# Patient Record
Sex: Female | Born: 1990 | Race: Black or African American | Hispanic: No | Marital: Single | Smoking: Never smoker
Health system: Southern US, Community
[De-identification: ages and names within clinical notes are randomized; demographics above are authoritative.]

## PROBLEM LIST (undated history)

## (undated) DIAGNOSIS — Z202 Contact with and (suspected) exposure to infections with a predominantly sexual mode of transmission: Secondary | ICD-10-CM

## (undated) DIAGNOSIS — D649 Anemia, unspecified: Secondary | ICD-10-CM

## (undated) HISTORY — PX: TONSILLECTOMY: SUR1361

## (undated) HISTORY — PX: WISDOM TOOTH EXTRACTION: SHX21

## (undated) HISTORY — PX: OTHER SURGICAL HISTORY: SHX169

## (undated) HISTORY — DX: Contact with and (suspected) exposure to infections with a predominantly sexual mode of transmission: Z20.2

---

## 2013-03-28 NOTE — L&D Delivery Note (Signed)
Delivery Note At 8:36 AM a viable female was delivered via Vaginal, Spontaneous Delivery (Presentation: Left Occiput Anterior).  APGAR: 8, 8; weight 6 lb 10.9 oz (3030 g).   Placenta status: Intact, Spontaneous.  Cord: 3 vessels with the following complications: None.  Cord pH: NA  Anesthesia: Epidural  Episiotomy: None Lacerations: Periurethral Suture Repair: 3.0 vicryl Est. Blood Loss (mL): 350  Mom to postpartum.  Baby to Couplet care / Skin to Skin.  Jovani Colquhoun J. 12/03/2013, 5:40 PM

## 2013-04-26 LAB — OB RESULTS CONSOLE GC/CHLAMYDIA
CHLAMYDIA, DNA PROBE: NEGATIVE
Gonorrhea: NEGATIVE

## 2013-04-26 LAB — OB RESULTS CONSOLE HIV ANTIBODY (ROUTINE TESTING): HIV: NONREACTIVE

## 2013-04-26 LAB — OB RESULTS CONSOLE ABO/RH: RH Type: POSITIVE

## 2013-04-26 LAB — OB RESULTS CONSOLE HEPATITIS B SURFACE ANTIGEN: Hepatitis B Surface Ag: NEGATIVE

## 2013-04-26 LAB — OB RESULTS CONSOLE RPR: RPR: NONREACTIVE

## 2013-04-26 LAB — OB RESULTS CONSOLE RUBELLA ANTIBODY, IGM: Rubella: IMMUNE

## 2013-11-29 ENCOUNTER — Telehealth (HOSPITAL_COMMUNITY): Payer: Self-pay | Admitting: *Deleted

## 2013-11-29 ENCOUNTER — Encounter (HOSPITAL_COMMUNITY): Payer: Self-pay | Admitting: *Deleted

## 2013-11-29 LAB — OB RESULTS CONSOLE GBS: STREP GROUP B AG: POSITIVE

## 2013-11-29 NOTE — Telephone Encounter (Signed)
Preadmission screen  

## 2013-12-02 ENCOUNTER — Inpatient Hospital Stay (HOSPITAL_COMMUNITY)
Admission: AD | Admit: 2013-12-02 | Discharge: 2013-12-05 | DRG: 775 | Disposition: A | Discharge status: Home / Self Care | Payer: BC Managed Care – PPO | Source: Ambulatory Visit | Attending: Obstetrics and Gynecology | Admitting: Obstetrics and Gynecology

## 2013-12-02 ENCOUNTER — Encounter (HOSPITAL_COMMUNITY): Payer: Self-pay

## 2013-12-02 DIAGNOSIS — O9989 Other specified diseases and conditions complicating pregnancy, childbirth and the puerperium: Secondary | ICD-10-CM

## 2013-12-02 DIAGNOSIS — Z833 Family history of diabetes mellitus: Secondary | ICD-10-CM

## 2013-12-02 DIAGNOSIS — Z880 Allergy status to penicillin: Secondary | ICD-10-CM

## 2013-12-02 DIAGNOSIS — D696 Thrombocytopenia, unspecified: Secondary | ICD-10-CM | POA: Diagnosis present

## 2013-12-02 DIAGNOSIS — D689 Coagulation defect, unspecified: Secondary | ICD-10-CM | POA: Diagnosis present

## 2013-12-02 DIAGNOSIS — O9912 Other diseases of the blood and blood-forming organs and certain disorders involving the immune mechanism complicating childbirth: Secondary | ICD-10-CM

## 2013-12-02 DIAGNOSIS — D649 Anemia, unspecified: Secondary | ICD-10-CM | POA: Diagnosis present

## 2013-12-02 DIAGNOSIS — O99892 Other specified diseases and conditions complicating childbirth: Secondary | ICD-10-CM | POA: Diagnosis present

## 2013-12-02 DIAGNOSIS — Z22330 Carrier of Group B streptococcus: Secondary | ICD-10-CM

## 2013-12-02 DIAGNOSIS — O9902 Anemia complicating childbirth: Secondary | ICD-10-CM | POA: Diagnosis present

## 2013-12-02 HISTORY — DX: Anemia, unspecified: D64.9

## 2013-12-02 NOTE — MAU Note (Signed)
Pt states SROM at 1015pm-had some bleeding-mucus. Contractions every 15-30 mins.

## 2013-12-03 ENCOUNTER — Encounter (HOSPITAL_COMMUNITY): Payer: BC Managed Care – PPO | Admitting: Anesthesiology

## 2013-12-03 ENCOUNTER — Encounter (HOSPITAL_COMMUNITY): Payer: Self-pay | Admitting: *Deleted

## 2013-12-03 ENCOUNTER — Inpatient Hospital Stay (HOSPITAL_COMMUNITY): Payer: BC Managed Care – PPO | Admitting: Anesthesiology

## 2013-12-03 DIAGNOSIS — Z833 Family history of diabetes mellitus: Secondary | ICD-10-CM | POA: Diagnosis not present

## 2013-12-03 DIAGNOSIS — D649 Anemia, unspecified: Secondary | ICD-10-CM | POA: Diagnosis present

## 2013-12-03 DIAGNOSIS — O9912 Other diseases of the blood and blood-forming organs and certain disorders involving the immune mechanism complicating childbirth: Secondary | ICD-10-CM | POA: Diagnosis present

## 2013-12-03 DIAGNOSIS — Z880 Allergy status to penicillin: Secondary | ICD-10-CM | POA: Diagnosis not present

## 2013-12-03 DIAGNOSIS — D696 Thrombocytopenia, unspecified: Secondary | ICD-10-CM | POA: Diagnosis present

## 2013-12-03 DIAGNOSIS — O9902 Anemia complicating childbirth: Secondary | ICD-10-CM | POA: Diagnosis present

## 2013-12-03 DIAGNOSIS — Z22330 Carrier of Group B streptococcus: Secondary | ICD-10-CM | POA: Diagnosis not present

## 2013-12-03 DIAGNOSIS — O99892 Other specified diseases and conditions complicating childbirth: Secondary | ICD-10-CM | POA: Diagnosis present

## 2013-12-03 DIAGNOSIS — O479 False labor, unspecified: Secondary | ICD-10-CM | POA: Diagnosis present

## 2013-12-03 LAB — CBC
HCT: 35.8 % — ABNORMAL LOW (ref 36.0–46.0)
Hemoglobin: 12.1 g/dL (ref 12.0–15.0)
MCH: 28.9 pg (ref 26.0–34.0)
MCHC: 33.8 g/dL (ref 30.0–36.0)
MCV: 85.6 fL (ref 78.0–100.0)
PLATELETS: 132 10*3/uL — AB (ref 150–400)
RBC: 4.18 MIL/uL (ref 3.87–5.11)
RDW: 13.8 % (ref 11.5–15.5)
WBC: 8 10*3/uL (ref 4.0–10.5)

## 2013-12-03 LAB — POCT FERN TEST: POCT FERN TEST: NEGATIVE

## 2013-12-03 LAB — RPR

## 2013-12-03 LAB — ABO/RH: ABO/RH(D): O POS

## 2013-12-03 MED ORDER — FLEET ENEMA 7-19 GM/118ML RE ENEM: 1.0000 | ENEMA | RECTAL | Status: DC | PRN

## 2013-12-03 MED ORDER — ONDANSETRON HCL 4 MG/2ML IJ SOLN: 4.0000 mg | INTRAMUSCULAR | Status: DC | PRN

## 2013-12-03 MED ORDER — WITCH HAZEL-GLYCERIN EX PADS
1.0000 | MEDICATED_PAD | CUTANEOUS | Status: DC | PRN
Start: 2013-12-03 — End: 2013-12-05

## 2013-12-03 MED ORDER — LACTATED RINGERS IV SOLN
INTRAVENOUS | Status: DC
  Administered 2013-12-03 (×3): via INTRAVENOUS

## 2013-12-03 MED ORDER — LACTATED RINGERS IV SOLN: 500.0000 mL | INTRAVENOUS | Status: DC | PRN

## 2013-12-03 MED ORDER — CITRIC ACID-SODIUM CITRATE 334-500 MG/5ML PO SOLN: 30.0000 mL | ORAL | Status: DC | PRN

## 2013-12-03 MED ORDER — ACETAMINOPHEN 325 MG PO TABS: 650.0000 mg | ORAL_TABLET | ORAL | Status: DC | PRN

## 2013-12-03 MED ORDER — DIBUCAINE 1 % RE OINT: 1.0000 "application " | TOPICAL_OINTMENT | RECTAL | Status: DC | PRN

## 2013-12-03 MED ORDER — SIMETHICONE 80 MG PO CHEW: 80.0000 mg | CHEWABLE_TABLET | ORAL | Status: DC | PRN

## 2013-12-03 MED ORDER — SENNOSIDES-DOCUSATE SODIUM 8.6-50 MG PO TABS
2.0000 | ORAL_TABLET | ORAL | Status: DC
  Administered 2013-12-04 – 2013-12-05 (×2): 2 via ORAL
  Filled 2013-12-03 (×2): qty 2

## 2013-12-03 MED ORDER — LIDOCAINE HCL (PF) 1 % IJ SOLN
30.0000 mL | INTRAMUSCULAR | Status: DC | PRN
  Filled 2013-12-03 (×2): qty 30

## 2013-12-03 MED ORDER — LACTATED RINGERS IV SOLN
500.0000 mL | Freq: Once | INTRAVENOUS | Status: DC
Start: 2013-12-03 — End: 2013-12-03

## 2013-12-03 MED ORDER — LIDOCAINE HCL (PF) 1 % IJ SOLN
INTRAMUSCULAR | Status: DC | PRN
Start: 2013-12-03 — End: 2013-12-04
  Administered 2013-12-03: 10 mL

## 2013-12-03 MED ORDER — PRENATAL MULTIVITAMIN CH
1.0000 | ORAL_TABLET | Freq: Every day | ORAL | Status: DC
Start: 2013-12-03 — End: 2013-12-05
  Administered 2013-12-03 – 2013-12-05 (×3): 1 via ORAL
  Filled 2013-12-03 (×3): qty 1

## 2013-12-03 MED ORDER — IBUPROFEN 600 MG PO TABS
600.0000 mg | ORAL_TABLET | Freq: Four times a day (QID) | ORAL | Status: DC
Start: 2013-12-03 — End: 2013-12-05
  Administered 2013-12-03 – 2013-12-05 (×9): 600 mg via ORAL
  Filled 2013-12-03 (×9): qty 1

## 2013-12-03 MED ORDER — TERBUTALINE SULFATE 1 MG/ML IJ SOLN: 0.2500 mg | Freq: Once | INTRAMUSCULAR | Status: DC | PRN

## 2013-12-03 MED ORDER — PHENYLEPHRINE 40 MCG/ML (10ML) SYRINGE FOR IV PUSH (FOR BLOOD PRESSURE SUPPORT)
80.0000 ug | PREFILLED_SYRINGE | INTRAVENOUS | Status: DC | PRN
  Filled 2013-12-03: qty 2

## 2013-12-03 MED ORDER — ZOLPIDEM TARTRATE 5 MG PO TABS: 5.0000 mg | ORAL_TABLET | Freq: Every evening | ORAL | Status: DC | PRN

## 2013-12-03 MED ORDER — PHENYLEPHRINE 40 MCG/ML (10ML) SYRINGE FOR IV PUSH (FOR BLOOD PRESSURE SUPPORT)
80.0000 ug | PREFILLED_SYRINGE | INTRAVENOUS | Status: DC | PRN
  Filled 2013-12-03: qty 2
  Filled 2013-12-03: qty 10

## 2013-12-03 MED ORDER — ONDANSETRON HCL 4 MG/2ML IJ SOLN: 4.0000 mg | Freq: Four times a day (QID) | INTRAMUSCULAR | Status: DC | PRN

## 2013-12-03 MED ORDER — FENTANYL 2.5 MCG/ML BUPIVACAINE 1/10 % EPIDURAL INFUSION (WH - ANES)
14.0000 mL/h | INTRAMUSCULAR | Status: DC | PRN
  Filled 2013-12-03: qty 125

## 2013-12-03 MED ORDER — OXYTOCIN BOLUS FROM INFUSION: 500.0000 mL | INTRAVENOUS | Status: DC

## 2013-12-03 MED ORDER — OXYCODONE-ACETAMINOPHEN 5-325 MG PO TABS: 2.0000 | ORAL_TABLET | ORAL | Status: DC | PRN

## 2013-12-03 MED ORDER — METHYLERGONOVINE MALEATE 0.2 MG PO TABS: 0.2000 mg | ORAL_TABLET | ORAL | Status: DC | PRN

## 2013-12-03 MED ORDER — NALBUPHINE HCL 10 MG/ML IJ SOLN: 5.0000 mg | INTRAMUSCULAR | Status: DC | PRN

## 2013-12-03 MED ORDER — OXYTOCIN 40 UNITS IN LACTATED RINGERS INFUSION - SIMPLE MED
1.0000 m[IU]/min | INTRAVENOUS | Status: DC
  Administered 2013-12-03: 2 m[IU]/min via INTRAVENOUS
  Filled 2013-12-03: qty 1000

## 2013-12-03 MED ORDER — OXYCODONE-ACETAMINOPHEN 5-325 MG PO TABS: 1.0000 | ORAL_TABLET | ORAL | Status: DC | PRN

## 2013-12-03 MED ORDER — LANOLIN HYDROUS EX OINT: TOPICAL_OINTMENT | CUTANEOUS | Status: DC | PRN

## 2013-12-03 MED ORDER — METHYLERGONOVINE MALEATE 0.2 MG/ML IJ SOLN: 0.2000 mg | INTRAMUSCULAR | Status: DC | PRN

## 2013-12-03 MED ORDER — FENTANYL 2.5 MCG/ML BUPIVACAINE 1/10 % EPIDURAL INFUSION (WH - ANES)
14.0000 mL/h | INTRAMUSCULAR | Status: DC | PRN
  Administered 2013-12-03: 14 mL/h via EPIDURAL

## 2013-12-03 MED ORDER — OXYTOCIN 40 UNITS IN LACTATED RINGERS INFUSION - SIMPLE MED: 62.5000 mL/h | INTRAVENOUS | Status: DC

## 2013-12-03 MED ORDER — DIPHENHYDRAMINE HCL 25 MG PO CAPS: 25.0000 mg | ORAL_CAPSULE | Freq: Four times a day (QID) | ORAL | Status: DC | PRN

## 2013-12-03 MED ORDER — FERROUS SULFATE 325 (65 FE) MG PO TABS
325.0000 mg | ORAL_TABLET | Freq: Two times a day (BID) | ORAL | Status: DC
  Administered 2013-12-03 – 2013-12-05 (×4): 325 mg via ORAL
  Filled 2013-12-03 (×4): qty 1

## 2013-12-03 MED ORDER — BENZOCAINE-MENTHOL 20-0.5 % EX AERO
1.0000 "application " | INHALATION_SPRAY | CUTANEOUS | Status: DC | PRN
  Administered 2013-12-03: 1 via TOPICAL
  Filled 2013-12-03: qty 56

## 2013-12-03 MED ORDER — ONDANSETRON HCL 4 MG PO TABS: 4.0000 mg | ORAL_TABLET | ORAL | Status: DC | PRN

## 2013-12-03 MED ORDER — EPHEDRINE 5 MG/ML INJ
10.0000 mg | INTRAVENOUS | Status: DC | PRN
  Filled 2013-12-03: qty 2

## 2013-12-03 MED ORDER — OXYCODONE-ACETAMINOPHEN 5-325 MG PO TABS
1.0000 | ORAL_TABLET | ORAL | Status: DC | PRN
  Filled 2013-12-03: qty 1

## 2013-12-03 MED ORDER — DIPHENHYDRAMINE HCL 50 MG/ML IJ SOLN: 12.5000 mg | INTRAMUSCULAR | Status: DC | PRN

## 2013-12-03 MED ORDER — CLINDAMYCIN PHOSPHATE 900 MG/50ML IV SOLN
900.0000 mg | Freq: Three times a day (TID) | INTRAVENOUS | Status: DC
  Administered 2013-12-03: 900 mg via INTRAVENOUS
  Filled 2013-12-03 (×3): qty 50

## 2013-12-03 NOTE — Anesthesia Postprocedure Evaluation (Signed)
  Anesthesia Post-op Note  Patient: Amber Lambert  Procedure(s) Performed: * No procedures listed *  Patient Location: Mother/Baby  Anesthesia Type:Epidural  Level of Consciousness: awake  Airway and Oxygen Therapy: Patient Spontanous Breathing  Post-op Pain: mild  Post-op Assessment: Patient's Cardiovascular Status Stable and Respiratory Function Stable  Post-op Vital Signs: stable  Last Vitals:  Filed Vitals:   12/03/13 1210  BP: 117/76  Pulse: 65  Temp: 37.1 C  Resp: 16    Complications: No apparent anesthesia complications

## 2013-12-03 NOTE — Lactation Note (Signed)
This note was copied from the chart of Boy Jizelle Conkey. Lactation Consultation Note  Patient Name: Boy Gussie Murton [id number] Date: 12/03/2013 Reason for consult: Initial assessment of this primipara and her newborn at 14 hours postpartum.  Mom is getting ready to take a shower and baby is asleep in open crib on his back.  Mom says she has been shown hand expression by her nurse and that baby has been latching well since delivery.  Baby had LATCH scores of 8 and 9 per RN assessment but was sleepy at recent STS breastfeeding attempt.  LC encouraged frequent STS and cue feedings.  LC encouraged review of Baby and Me pp 9, 14 and 20-25 for STS and BF information. LC provided [employer] brochure and reviewed [facility] services and list of community and web site resources.    Maternal Data Formula Feeding for Exclusion: No Has patient been taught Hand Expression?: Yes (mom states that her nurse has shown her how to hand express) Does the patient have breastfeeding experience prior to this delivery?: No  Feeding Feeding Type: Breast Fed  LATCH Score/Interventions Latch: Grasps breast easily, tongue down, lips flanged, rhythmical sucking. Intervention(s): Skin to skin;Teach feeding cues;Waking techniques  Audible Swallowing: None Intervention(s): Skin to skin;Hand expression Intervention(s): Skin to skin;Hand expression;Alternate breast massage  Type of Nipple: Flat Intervention(s): No intervention needed  Comfort (Breast/Nipple): Soft / non-tender     Hold (Positioning): Assistance needed to correctly position infant at breast and maintain latch. Intervention(s): Breastfeeding basics reviewed;Support Pillows;Position options;Skin to skin  LATCH Score: 6 (previous breastfeeding LATCH scores=8/9)  Lactation Tools Discussed/Used   STS, cue feedings, hand expression  Consult Status Consult Status: Follow-up Date: 12/04/13 Follow-up type: In-patient    Warrick Parisian  [facility] 12/03/2013, 10:39 PM

## 2013-12-03 NOTE — MAU Provider Note (Signed)
Ms. Amber Lambert is a 23 y.o. G1P0 at [redacted]w[redacted]d  who presents to [name] today complaining LOF and vaginal bleeding. The patient denies frequent contractions. She states ?ROM around 2230 today. She reports good fetal movement. She denies complications with the pregnancy.   BP 117/71  Pulse 75  Temp(Src) 98.3 F (36.8 C) (Oral)  Resp 18  Ht  (1.626 m)  Wt 174 lb (78.926 kg)  BMI 29.85 kg/m2  SpO2 99% GENERAL: Well-developed, well-nourished female in no acute distress.  HEENT: Normocephalic, atraumatic.  LUNGS: Normal effort HEART: Regular rate  ABDOMEN: Soft, nontender, nondistended.  PELVIC: Normal external female genitalia. Vagina is pink and rugated.  Small amount of dark brown blood noted in the vaginal vault.  negative pooling. Gravid uterus.   EXTREMITIES: No cyanosis, clubbing, or edema Dilation: 3 Effacement (%): 80 Cervical Position: Middle Station: -2 Exam by:: [name] RN  Fern - negative  Fetal Monitoring:  Baseline: 125 bpm, moderate variability, + accelerations, 2 decelerations noted Contractions: irregular  A: Membranes intact FHR decelerations  P: Report given to RN. CNM contacted. Will admit patient to L&D  Marny Lowenstein, PA-C 12/03/2013 12:52 AM

## 2013-12-03 NOTE — Anesthesia Preprocedure Evaluation (Signed)

## 2013-12-03 NOTE — Progress Notes (Addendum)
  Subjective: Feeling lots of pressure, o/w comfortable w/ epidural. Thinks she may have SROM'd around 0550.  Objective: BP 114/78  Pulse 78  Temp(Src) 98.6 F (37 C) (Oral)  Resp 18  Ht  (1.626 m)  Wt 174 lb (78.926 kg)  BMI 29.85 kg/m2  SpO2 100%      Results for orders placed during the hospital encounter of 12/02/13 (from the past 24 hour(s))  POCT FERN TEST     Status: None   Collection Time    12/03/13  1:19 AM      Result Value Ref Range   POCT Fern Test Negative = intact amniotic membranes    CBC     Status: Abnormal   Collection Time    12/03/13  5:18 AM      Result Value Ref Range   WBC 8.0  4.0 - 10.5 K/uL   RBC 4.18  3.87 - 5.11 MIL/uL   Hemoglobin 12.1  12.0 - 15.0 g/dL   HCT 16.1 (*) 09.6 - 04.5 %   MCV 85.6  78.0 - 100.0 fL   MCH 28.9  26.0 - 34.0 pg   MCHC 33.8  30.0 - 36.0 g/dL   RDW 40.9  81.1 - 91.4 %   Platelets 132 (*) 150 - 400 K/uL     FHT: BL 130 w/ mod variability, +accels, earlys, occ lates resolved w/ maternal resuscitative measures UC:   irregular, every 2-3 minutes SVE: 4/90/0 Pitocin at 6 mius/min Has received one dose of ABX SROM - leaking clear fluid IUPC & FSE placed  Assessment:  Progressive labor GBS positive Mild gestational thrombocytopenia  Plan: Continue w/ current POC Continue GBS prophylaxis Consult prn Anticipate progress and SVD  Sherre Scarlet CNM 12/03/2013, 5:59 AM

## 2013-12-03 NOTE — Progress Notes (Signed)
Lucianne Comes is a 23 y.o. G1P0 at [redacted]w[redacted]d  Subjective: Pt is uncomfortable .Marland Kitchen Feeling pressure Epidural has been bolused 3 times   Objective: BP 109/63  Pulse 80  Temp(Src) 98.6 F (37 C) (Axillary)  Resp 22  Ht  (1.626 m)  Wt 78.926 kg (174 lb)  BMI 29.85 kg/m2  SpO2 100%      FHT:  FHR: 130 bpm, variability: moderate,  accelerations:  Present,  decelerations:  Present early UC:   regular, every 2 minutes SVE:   Complete +2 station Labs: Lab Results  Component Value Date   WBC 8.0 12/03/2013   HGB 12.1 12/03/2013   HCT 35.8* 12/03/2013   MCV 85.6 12/03/2013   PLT 132* 12/03/2013    Assessment / Plan: 39 wks and 5 days in labor  Completely dilated anticipate svd    Cricket Goodlin J. 12/03/2013, 8:20 AM

## 2013-12-03 NOTE — Anesthesia Procedure Notes (Signed)
Epidural Patient location during procedure: OB  Preanesthetic Checklist Completed: patient identified, site marked, surgical consent, pre-op evaluation, timeout performed, IV checked, risks and benefits discussed and monitors and equipment checked  Epidural Patient position: sitting Prep: site prepped and draped and DuraPrep Patient monitoring: continuous pulse ox and blood pressure Approach: midline Injection technique: LOR air  Needle:  Needle type: Tuohy  Needle gauge: 17 G Needle length: 9 cm and 9 Needle insertion depth: 6 cm Catheter type: closed end flexible Catheter size: 19 Gauge Catheter at skin depth: 12 cm Test dose: negative  Assessment Events: blood not aspirated, injection not painful, no injection resistance, negative IV test and paresthesia  Additional Notes 1st catheter with transient R leg paresthesia Replaced catheter as above:  Dosing of Epidural:  1st dose, through catheter ............................................Marland Kitchen  Xylocaine 40 mg  2nd dose, through catheter, after waiting 3 minutes........Marland KitchenXylocaine 60 mg    ( 1% Xylo charted as a single dose in Epic Meds for ease of charting; actual dosing was fractionated as above, for saftey's sake)  As each dose occurred, patient was free of IV sx; and patient exhibited no evidence of SA injection.  Patient is more comfortable after epidural dosed. Please see RN's note for documentation of vital signs,and FHR which are stable.  Patient reminded not to try to ambulate with numb legs, and that an RN must be present when she attempts to get up.

## 2013-12-03 NOTE — H&P (Signed)
Amber Lambert is a 23 y.o. female, G1P0 at 39.5 weeks, pt of Dr. Charlotta Lambert, presented after calling w/ c/o ? LOF, q 15-30 min mild ctxs, and a small amount of brownish d/c since around 10:15 pm last evening. Reports active fetus.    Patient Active Problem List   Diagnosis Date Noted  . Normal labor 12/03/2013  . Allergy history, penicillin 12/03/2013  . Anemia 12/03/2013    History of present pregnancy: Patient entered care around 8 weeks.   EDC of 12/05/13 was established by 8.6 wk sono. Had +hCG on 04/26/13.   Anatomy scan: 21.4 weeks, with normal findings per pt report.   Additional [place] evaluations: Dating and viability @ 8.6 wks and later in the pregnancy for presentation per pt report.   Significant prenatal events: Struggled w/ low back pain and gluteal muscle pain for which she took Ibuprofen early on w/ relief. Last evaluation: 11/29/13  OB History   Grav Para Term Preterm Abortions TAB SAB Ect Mult Living   1              Past Medical History  Diagnosis Date  . Trichomonas contact, treated   . Anemia    Past Surgical History  Procedure Laterality Date  . Tonsillectomy    . Wisdom tooth extraction    . Addenoidectomy     Family History: family history includes Diabetes in her paternal grandfather; Hypertension in her paternal grandfather. and father. Paternal 1st cousin w/ Amber Lambert. Social History:  reports that she has never smoked. She does not have any smokeless tobacco history on file. She reports that she does not drink alcohol or use illicit drugs.FOB Amber Lambert present and supportive.   Prenatal Transfer Tool  Maternal Diabetes: No Genetic Screening: Declined Maternal Ultrasounds/Referrals: Normal Fetal Ultrasounds or other Referrals:  None Maternal Substance Abuse:  No Significant Maternal Medications:  Meds include: Other: PNV, Ibuprofen early in the pregnancy, sinus meds prn and FeS04 - started taking once daily on 11/29/13 Significant Maternal Lab Results: Lab  values include: Group B Strep positive; allergy to PCN    ROS: +FM, ctxs  Allergies  Allergen Reactions  . Penicillins Rash    As a child    Dilation: 3 Effacement (%): 80 Station: -2 Exam by:: Amber Confer RN Blood pressure 117/71, pulse 75, temperature 98.3 F (36.8 C), temperature source Oral, resp. rate 18, height  (1.626 m), weight 174 lb (78.926 kg), SpO2 99.00%.  Results for orders placed during the hospital encounter of 12/02/13 (from the past 24 hour(s))  POCT FERN TEST     Status: None   Collection Time    12/03/13  1:19 AM      Result Value Ref Range   POCT Fern Test Negative = intact amniotic membranes      Chest clear Heart RRR without murmur Abd gravid, soft btw ctxs, NT, FH CWD Cephalic by Leopolds EFW: 7-4 Pelvic: Old blood in vaginal vault. Neg pooling, neg valsalva, neg fern Ext: No edema. 2+DTRs bilaterally, no clonus elicited  FHR: BL 135-140 w/ mod variability, +accels, 2 late decels to 120s noted; resolved w/ position change UCs: Irregular, q 1-15 mins, palpate mild  Prenatal labs: ABO, Rh: O/Positive/-- (01/30 0000) Antibody:  Neg Rubella:   Immune RPR: Nonreactive (01/30 0000)  HBsAg: Negative (01/30 0000)  HIV: Non-reactive (01/30 0000)  GBS: Positive (09/04 0000)  Sickle cell: Neg Pap: Neg in March 2014 per pt report GC:  Neg on 05/01/13 Chlamydia: Neg on  05/01/13 Genetic screenings: Declined Glucola: Normal at 73 Other: NOB urine culture neg; NOB Hbg 12.1, 10.7 at 28 wks; normal TSH, varicella immune; neg wet mount on 08/28/13.   Assessment/Plan: IUP at 39.5 wks. 2 late decels noted while in [name]; now Cat 1 tracing Latent labor GBS positive. Will treat w/ Clindamycin due to possible urticaria w/ PCN. Anemia  Plan: Admitted to BS. Routine [facility] orders. GBS prophylaxis. Epidural as desired. Rv'd FHRT and latent labor status w/ pt and FOB. Rv'd Pitocin augmentation. Pt and FOB seem to understand rationale for  admission/augmentation and wish to proceed w/ plan. Consult as indicated. Anticipate progress and SVD.   Amber Askew, MS 12/03/2013, 1:41 AM

## 2013-12-04 LAB — CBC
HCT: 31.9 % — ABNORMAL LOW (ref 36.0–46.0)
Hemoglobin: 10.5 g/dL — ABNORMAL LOW (ref 12.0–15.0)
MCH: 28.4 pg (ref 26.0–34.0)
MCHC: 32.9 g/dL (ref 30.0–36.0)
MCV: 86.2 fL (ref 78.0–100.0)
PLATELETS: 137 10*3/uL — AB (ref 150–400)
RBC: 3.7 MIL/uL — AB (ref 3.87–5.11)
RDW: 13.8 % (ref 11.5–15.5)
WBC: 11.2 10*3/uL — AB (ref 4.0–10.5)

## 2013-12-04 NOTE — Progress Notes (Signed)
Postpartum Note Day # 1  S:  Patient resting comfortable in bed.  Pain controlled.  Tolerating general diet. No flatus, no BM.  Lochia moderate.  Ambulating without difficulty.  She denies n/v/f/c, SOB, or CP.  Pt plans on breastfeeding.  O: Temp:  [98.2 F (36.8 C)-98.8 F (37.1 C)] 98.4 F (36.9 C) (09/09 0650) Pulse Rate:  [56-106] 82 (09/09 0650) Resp:  [16-22] 20 (09/09 0650) BP: (101-133)/(63-82) 105/67 mmHg (09/09 0650) SpO2:  [100 %] 100 % (09/08 1610) Gen: A&Ox3, NAD CV: RRR, no MRG Resp: CTAB Abdomen: soft, NT, ND Uterus: firm, non-tender, below umbilicus Ext: No edema, no calf tenderness bilaterally  Labs:  Recent Labs  12/03/13 0518 12/04/13 0620  HGB 12.1 10.5*    A/P: Pt is a 23 y.o. G1P1001 s/p NSVD, PPD#1  - Pain well controlled -GU: Voiding freely -GI: Tolerating general diet -Activity: encouraged sitting up to chair and ambulation as tolerated -Prophylaxis: early ambulation -Labs: stable as above -Pt considering in-patient circumcision DISPO: Continue routine postpartum care, plan for discharge home tomorrow.  Myna Hidalgo, DO [phone] (pager) [phone] (office)

## 2013-12-05 MED ORDER — IBUPROFEN 600 MG PO TABS: 600.0000 mg | ORAL_TABLET | Freq: Four times a day (QID) | ORAL | Status: DC

## 2013-12-05 NOTE — Discharge Instructions (Signed)

## 2013-12-05 NOTE — Progress Notes (Signed)
Postpartum Note Day # 2  S:  Patient resting comfortable in bed.  Pain controlled.  Tolerating general diet. +BM.  Lochia moderate.  Ambulating without difficulty.  She denies n/v/f/c, SOB, or CP.  Pt plans on breastfeeding.  O: Temp:  [97.5 F (36.4 C)-98 F (36.7 C)] 98 F (36.7 C) (09/10 0653) Pulse Rate:  [54-71] 54 (09/10 0653) Resp:  [17-20] 17 (09/10 0653) BP: (106-108)/(72-75) 106/75 mmHg (09/10 0653) SpO2:  [100 %] 100 % (09/09 1807) Gen: A&Ox3, NAD CV: RRR, no MRG Resp: CTAB Abdomen: soft, NT, ND Uterus: firm, non-tender, below umbilicus Ext: No edema, no calf tenderness bilaterally  Labs:   Recent Labs  12/03/13 0518 12/04/13 0620  HGB 12.1 10.5*    A/P: Pt is a 23 y.o. G1P1001 s/p NSVD, PPD#2  - Pain well controlled -GU: Voiding freely -GI: Tolerating general diet -Activity: encouraged sitting up to chair and ambulation as tolerated -Prophylaxis: early ambulation -Labs: stable as above -Pt plans for outpatient circ DISPO: Plan for discharge home today as she is meeting postpartum milestones appropriately.  Myna Hidalgo, DO [phone] (pager) [phone] (office)

## 2013-12-10 NOTE — Discharge Summary (Signed)
Obstetric Discharge Summary Date of Admission: 12/03/13 Date of Discharge: 12/05/13 Reason for Admission: onset of labor and rupture of membranes Prenatal Procedures: none2 Intrapartum Procedures: spontaneous vaginal delivery and GBS prophylaxis Postpartum Procedures: none Complications-Operative and Postpartum: periurethral laceration Hemoglobin  Date Value Ref Range Status  12/04/2013 10.5* 12.0 - 15.0 g/dL Final     HCT  Date Value Ref Range Status  12/04/2013 31.9* 36.0 - 46.0 % Final   Hospital Course:  Amber Lambert is a 23 y.o. female, G1P0 at 39.5 weeks, pt of Dr. Charlotta Newton, presented after calling w/ c/o ? LOF, q 15-30 min mild ctxs, and a small amount of brownish discharge since the previous night (12/02/13).  On admission she was confirmed ruptured and found to be 3cm dilated.  Pregnancy complicated by: GBS positive, with PCN allergy and anemia.  She was managed expectantly and received an epidural for pain.  Internal monitors were placed including an IUPC and FSE.  Patient progressed to complete and delivery was performed by Dr. Richardson Dopp.  For information regarding the delivery please see the delivery summary.  Her postpartum course was uncomplicated and she was discharged home in stable condition on PPD#2.   Physical Exam: Gen: A&Ox3, NAD  CV: RRR, no MRG  Resp: CTAB  Abdomen: soft, NT, ND  Uterus: firm, non-tender, below umbilicus  Ext: No edema, no calf tenderness bilaterally   Discharge Diagnoses: Term Pregnancy-delivered  Discharge Information: Date: 12/10/2013 Activity: pelvic rest Diet: routine Medications: PNV, Ibuprofen and Tylenol Condition: stable Instructions: refer to practice specific booklet Discharge to: home Follow-up Information   Follow up with Myna Hidalgo, M, DO In 6 weeks.   Specialty:  Obstetrics and Gynecology   Contact information:   [address] [place] [address] [phone]       Newborn Data: Live born female  Birth  Weight: 6 lb 10.9 oz (3030 g) APGAR: 8, 8  Home with mother.  Myna Hidalgo, M 12/10/2013, 6:41 AM

## 2013-12-11 ENCOUNTER — Inpatient Hospital Stay (HOSPITAL_COMMUNITY): Admission: RE | Admit: 2013-12-11 | Payer: BC Managed Care – PPO | Source: Ambulatory Visit

## 2014-01-15 ENCOUNTER — Other Ambulatory Visit: Payer: Self-pay | Admitting: Nurse Practitioner

## 2014-01-15 ENCOUNTER — Other Ambulatory Visit (HOSPITAL_COMMUNITY)
Admission: RE | Admit: 2014-01-15 | Discharge: 2014-01-15 | Disposition: A | Discharge status: Home / Self Care | Payer: BC Managed Care – PPO | Source: Ambulatory Visit | Attending: Obstetrics & Gynecology | Admitting: Obstetrics & Gynecology

## 2014-01-15 DIAGNOSIS — Z1151 Encounter for screening for human papillomavirus (HPV): Secondary | ICD-10-CM | POA: Diagnosis present

## 2014-01-15 DIAGNOSIS — Z01411 Encounter for gynecological examination (general) (routine) with abnormal findings: Secondary | ICD-10-CM | POA: Diagnosis present

## 2014-01-15 DIAGNOSIS — R87810 Cervical high risk human papillomavirus (HPV) DNA test positive: Secondary | ICD-10-CM | POA: Diagnosis present

## 2014-01-17 LAB — CYTOLOGY - PAP

## 2014-01-28 ENCOUNTER — Encounter (HOSPITAL_COMMUNITY): Payer: Self-pay | Admitting: *Deleted

## 2014-02-05 ENCOUNTER — Other Ambulatory Visit: Payer: Self-pay | Admitting: Obstetrics & Gynecology

## 2018-03-28 NOTE — L&D Delivery Note (Addendum)
Delivery Note Called emergently for patient who just arrived in [name] with fetus crowning.  Pt states she drove from [place].   At 4:02 AM a viable and healthy female was delivered via Vaginal, Spontaneous (Presentation: OA ).  APGAR: 8, 9; weight  .   Placenta status: Spontaneous and grossly intact with 3 vessel Cord:  with the following complications: .none except precipitous delivery  Anesthesia:  none Episiotomy:  none Lacerations: Labial Suture Repair: none. Laceration was superficial and hemostatic Est. Blood Loss (mL): 350  Mom to postpartum.  Baby to Couplet care / Skin to Skin.  Hansel Feinstein 12/12/2018, 5:17 AM

## 2018-04-20 ENCOUNTER — Encounter: Payer: Self-pay | Admitting: Advanced Practice Midwife

## 2018-04-20 ENCOUNTER — Ambulatory Visit (INDEPENDENT_AMBULATORY_CARE_PROVIDER_SITE_OTHER): Payer: 59

## 2018-04-20 DIAGNOSIS — Z3201 Encounter for pregnancy test, result positive: Secondary | ICD-10-CM | POA: Diagnosis not present

## 2018-04-20 DIAGNOSIS — N926 Irregular menstruation, unspecified: Secondary | ICD-10-CM

## 2018-04-20 LAB — POCT URINE PREGNANCY: PREG TEST UR: POSITIVE — AB

## 2018-04-20 NOTE — Progress Notes (Signed)
I have reviewed the chart and agree with nursing staff's documentation of this patient's encounter.  Vonzella Nipple, PA-C 04/20/2018 11:00 AM

## 2018-04-20 NOTE — Progress Notes (Signed)
Amber Lambert presents today for UPT. She has no unusual complaints. LMP: 03/17/19    OBJECTIVE: Appears well, in no apparent distress.  OB History    Gravida  2   Para  1   Term  1   Preterm      AB      Living  1     SAB      TAB      Ectopic      Multiple      Live Births  1          Home UPT Result: Positive In-Office UPT result: Positive  I have reviewed the patient's medical, obstetrical, social, and family histories, and medications.   ASSESSMENT: Positive pregnancy test  PLAN Prenatal care to be completed at:  [facility] PNV, samples given

## 2018-04-30 ENCOUNTER — Encounter: Payer: Self-pay | Admitting: *Deleted

## 2018-05-21 ENCOUNTER — Telehealth: Payer: Self-pay

## 2018-05-21 NOTE — Telephone Encounter (Signed)
Patient called in advising that she does not want to have the Genetic Screening test done at her Initial OB visit on 03/03//20 @ 1 pm. Patient states she didn't have it done with her nor does she want it done with this baby. Advised patient that is perfectly ok  - I would notate her chart accordingly but to remember to advise the provider as well. Patient stated she would and had no further questions or concerns at this time.

## 2018-05-29 ENCOUNTER — Ambulatory Visit (INDEPENDENT_AMBULATORY_CARE_PROVIDER_SITE_OTHER): Payer: Managed Care, Other (non HMO) | Admitting: Advanced Practice Midwife

## 2018-05-29 ENCOUNTER — Encounter: Payer: Self-pay | Admitting: Advanced Practice Midwife

## 2018-05-29 VITALS — BP 110/70 | HR 76 | Wt 160.2 lb

## 2018-05-29 DIAGNOSIS — Z3480 Encounter for supervision of other normal pregnancy, unspecified trimester: Secondary | ICD-10-CM | POA: Insufficient documentation

## 2018-05-29 DIAGNOSIS — O219 Vomiting of pregnancy, unspecified: Secondary | ICD-10-CM | POA: Diagnosis not present

## 2018-05-29 DIAGNOSIS — Z3A00 Weeks of gestation of pregnancy not specified: Secondary | ICD-10-CM | POA: Diagnosis not present

## 2018-05-29 DIAGNOSIS — O26891 Other specified pregnancy related conditions, first trimester: Secondary | ICD-10-CM

## 2018-05-29 DIAGNOSIS — R87619 Unspecified abnormal cytological findings in specimens from cervix uteri: Secondary | ICD-10-CM

## 2018-05-29 DIAGNOSIS — Z3A10 10 weeks gestation of pregnancy: Secondary | ICD-10-CM

## 2018-05-29 LAB — POCT URINALYSIS DIPSTICK OB
Blood, UA: NEGATIVE
Glucose, UA: NEGATIVE
Ketones, UA: NEGATIVE
LEUKOCYTES UA: NEGATIVE
Nitrite, UA: NEGATIVE
PH UA: 7 (ref 5.0–8.0)
POC,PROTEIN,UA: NEGATIVE
Spec Grav, UA: 1.015 (ref 1.010–1.025)

## 2018-05-29 MED ORDER — DOXYLAMINE-PYRIDOXINE 10-10 MG PO TBEC: DELAYED_RELEASE_TABLET | ORAL | 5 refills | Status: DC

## 2018-05-29 MED ORDER — CONCEPT OB 130-92.4-1 MG PO CAPS: 1.0000 | ORAL_CAPSULE | Freq: Every day | ORAL | 11 refills | Status: AC

## 2018-05-29 NOTE — Patient Instructions (Signed)

## 2018-05-29 NOTE — Progress Notes (Signed)
Subjective:   Amber Lambert is a 28 y.o. G2P1001 at Unknown by sure LMP being seen today for her first obstetrical visit.  Her obstetrical history is significant for none and has Allergy history, penicillin; Anemia; Vaginal delivery; and Supervision of other normal pregnancy, antepartum on their problem list.. Patient does intend to breast feed. Pregnancy history fully reviewed.  Patient reports nausea and vomiting.  HISTORY: OB History  Gravida Para Term Preterm AB Living  2 1 1  0 0 1  SAB TAB Ectopic Multiple Live Births  0 0 0 0 1    # Outcome Date GA Lbr Len/2nd Weight Sex Delivery Anes PTL Lv  2 Current           1 Term 12/03/13 [redacted]w[redacted]d 05:41 / 00:25 3030 g M Vag-Spont EPI  LIV     Name: Morency,BOY Mechele     Apgar1: 8  Apgar5: 8   Past Medical History:  Diagnosis Date  . Anemia   . Trichomonas contact, treated    Past Surgical History:  Procedure Laterality Date  . addenoidectomy    . TONSILLECTOMY    . WISDOM TOOTH EXTRACTION     Family History  Problem Relation Age of Onset  . Hypertension Paternal Grandfather   . Diabetes Paternal Grandfather   . Hypertension Father    Social History   Tobacco Use  . Smoking status: Never Smoker  . Smokeless tobacco: Never Used  Substance Use Topics  . Alcohol use: No  . Drug use: No   Allergies  Allergen Reactions  . Penicillins Rash    As a child   Current Outpatient Medications on File Prior to Visit  Medication Sig Dispense Refill  . Prenatal Vit-Fe Fumarate-FA (PRENATAL MULTIVITAMIN) TABS tablet Take 1 tablet by mouth daily at 12 noon.    . ferrous sulfate 325 (65 FE) MG tablet Take 325 mg by mouth daily with breakfast.    . ibuprofen (ADVIL,MOTRIN) 600 MG tablet Take 1 tablet (600 mg total) by mouth every 6 (six) hours. (Patient not taking: Reported on 05/29/2018) 30 tablet 0   No current facility-administered medications on file prior to visit.      Exam   Vitals:   05/29/18 1318  BP: 110/70    Pulse: 76  Weight: 72.7 kg      Uterus:     Pelvic Exam: Perineum: no hemorrhoids, normal perineum   Vulva: normal external genitalia, no lesions   Vagina:  normal mucosa, normal discharge   Cervix: no lesions and normal, pap smear done.    Adnexa: normal adnexa and no mass, fullness, tenderness   Bony Pelvis: average  System: General: well-developed, well-nourished female in no acute distress   Breast:  normal appearance, no masses or tenderness   Skin: normal coloration and turgor, no rashes   Neurologic: oriented, normal, negative, normal mood   Extremities: normal strength, tone, and muscle mass, ROM of all joints is normal   HEENT PERRLA, extraocular movement intact and sclera clear, anicteric   Mouth/Teeth mucous membranes moist, pharynx normal without lesions and dental hygiene good   Neck supple and no masses   Cardiovascular: regular rate and rhythm   Respiratory:  no respiratory distress, normal breath sounds   Abdomen: soft, non-tender; bowel sounds normal; no masses,  no organomegaly     Assessment:   Pregnancy: G2P1001 Patient Active Problem List   Diagnosis Date Noted  . Supervision of other normal pregnancy, antepartum 05/29/2018  . Vaginal  delivery 12/05/2013  . Allergy history, penicillin 12/03/2013  . Anemia 12/03/2013     Plan:  1. Supervision of other normal pregnancy, antepartum --Anticipatory guidance about next visits/weeks of pregnancy given. - Culture, OB Urine - Obstetric Panel, Including HIV - Hemoglobinopathy evaluation - Inheritest(R) CF/SMA Panel - Cytology - PAP( [place]) - Cervicovaginal ancillary only( [place]) - [facility] AMB BABYSCRIPTS OPT IN - Genetic Screening  2. Nausea and vomiting during pregnancy prior to [redacted] weeks gestation --Discussed dietary changes --Changed PNV to [facility] - Doxylamine-Pyridoxine (DICLEGIS) 10-10 MG TBEC; Take 2 tabs at bedtime. If needed, add another tab in the morning. If needed, add another tab  in the afternoon, up to 4 tabs/day.  Dispense: 100 tablet; Refill: 5   3. Abnormal cervical Papanicolaou smear, unspecified abnormal pap finding --Pt had abnormal Pap then normal colpo in December 2019.  A LEEP was recommended per pt. She did not have LEEP done.   --Records requested but if colpo was normal, may repeat pap PP  Initial labs drawn. Continue prenatal vitamins. Discussed and offered genetic screening options, including Quad screen/AFP, NIPS testing, and option to decline testing. Benefits/risks/alternatives reviewed. Pt aware that anatomy US is form of genetic screening with lower accuracy in detecting trisomies than blood work.  Pt chooses/declines genetic screening today. NIPS: ordered. Ultrasound discussed; fetal anatomic survey: requested. Problem list reviewed and updated. The nature of [facility] with multiple MDs and other Advanced Practice Providers was explained to patient; also emphasized that residents, students are part of our team. Routine obstetric precautions reviewed. No follow-ups on file.   Sharen Counter, CNM 05/29/18 1:41 PM

## 2018-05-29 NOTE — Addendum Note (Signed)
Addended by: Mikey Bussing on: 05/29/2018 02:45 PM   Modules accepted: Orders

## 2018-05-29 NOTE — Addendum Note (Signed)
Addended by: Sharen Counter A on: 05/29/2018 03:35 PM   Modules accepted: Orders

## 2018-05-30 ENCOUNTER — Encounter: Payer: 59 | Admitting: Obstetrics and Gynecology

## 2018-05-31 LAB — URINE CULTURE, OB REFLEX: Organism ID, Bacteria: NO GROWTH

## 2018-05-31 LAB — CULTURE, OB URINE

## 2018-06-09 LAB — HEMOGLOBINOPATHY EVALUATION
HEMOGLOBIN F QUANTITATION: 0 % (ref 0.0–2.0)
HGB C: 0 %
HGB S: 0 %
HGB VARIANT: 0 %
Hemoglobin A2 Quantitation: 2.3 % (ref 1.8–3.2)
Hgb A: 97.7 % (ref 96.4–98.8)

## 2018-06-09 LAB — OBSTETRIC PANEL, INCLUDING HIV
ANTIBODY SCREEN: NEGATIVE
Basophils Absolute: 0 10*3/uL (ref 0.0–0.2)
Basos: 1 %
EOS (ABSOLUTE): 0 10*3/uL (ref 0.0–0.4)
EOS: 0 %
HEMOGLOBIN: 12.4 g/dL (ref 11.1–15.9)
HIV SCREEN 4TH GENERATION: NONREACTIVE
Hematocrit: 36.2 % (ref 34.0–46.6)
Hepatitis B Surface Ag: NEGATIVE
Immature Grans (Abs): 0 10*3/uL (ref 0.0–0.1)
Immature Granulocytes: 1 %
Lymphocytes Absolute: 0.9 10*3/uL (ref 0.7–3.1)
Lymphs: 10 %
MCH: 27.5 pg (ref 26.6–33.0)
MCHC: 34.3 g/dL (ref 31.5–35.7)
MCV: 80 fL (ref 79–97)
Monocytes Absolute: 0.9 10*3/uL (ref 0.1–0.9)
Monocytes: 10 %
NEUTROS ABS: 6.9 10*3/uL (ref 1.4–7.0)
Neutrophils: 78 %
Platelets: 257 10*3/uL (ref 150–450)
RBC: 4.51 x10E6/uL (ref 3.77–5.28)
RDW: 13 % (ref 11.7–15.4)
RPR Ser Ql: NONREACTIVE
Rh Factor: POSITIVE
Rubella Antibodies, IGG: 3.76 index (ref >0.99)
WBC: 8.8 10*3/uL (ref 3.4–10.8)

## 2018-06-09 LAB — INHERITEST(R) CF/SMA PANEL

## 2018-06-26 ENCOUNTER — Encounter: Payer: Managed Care, Other (non HMO) | Admitting: Advanced Practice Midwife

## 2018-07-02 ENCOUNTER — Encounter: Payer: Managed Care, Other (non HMO) | Admitting: Advanced Practice Midwife

## 2018-07-18 ENCOUNTER — Ambulatory Visit (INDEPENDENT_AMBULATORY_CARE_PROVIDER_SITE_OTHER): Payer: Managed Care, Other (non HMO) | Admitting: Obstetrics & Gynecology

## 2018-07-18 ENCOUNTER — Encounter: Payer: Self-pay | Admitting: Obstetrics & Gynecology

## 2018-07-18 ENCOUNTER — Other Ambulatory Visit: Payer: Self-pay

## 2018-07-18 DIAGNOSIS — Z3482 Encounter for supervision of other normal pregnancy, second trimester: Secondary | ICD-10-CM

## 2018-07-18 DIAGNOSIS — Z3A17 17 weeks gestation of pregnancy: Secondary | ICD-10-CM

## 2018-07-18 DIAGNOSIS — Z3480 Encounter for supervision of other normal pregnancy, unspecified trimester: Secondary | ICD-10-CM

## 2018-07-18 NOTE — Progress Notes (Signed)
Pt on line for her webex appointment. Pt identified with 2 identifiers. Pt is [redacted]w[redacted]d. Pt has was enrolled in babyrx today. She has no concerns.

## 2018-07-18 NOTE — Patient Instructions (Signed)

## 2018-07-18 NOTE — Progress Notes (Signed)
   TELEHEALTH VIRTUAL OBSTETRICS VISIT ENCOUNTER NOTE  I connected with Amber Lambert on 07/18/18 at  3:15 PM EDT by telephone at home and verified that I am speaking with the correct person using two identifiers.   I discussed the limitations, risks, security and privacy concerns of performing an evaluation and management service by telephone and the availability of in person appointments. I also discussed with the patient that there may be a patient responsible charge related to this service. The patient expressed understanding and agreed to proceed.  Subjective:  Amber Lambert is a 28 y.o. G2P1001 at [redacted]w[redacted]d being followed for ongoing prenatal care.  She is currently monitored for the following issues for this low-risk pregnancy and has Allergy history, penicillin and Supervision of other normal pregnancy, antepartum on their problem list.  Patient reports no complaints. Reports fetal movement. Denies any contractions, bleeding or leaking of fluid.   The following portions of the patient's history were reviewed and updated as appropriate: allergies, current medications, past family history, past medical history, past social history, past surgical history and problem list.   Objective:   General:  Alert, oriented and cooperative.   Mental Status: Normal mood and affect perceived. Normal judgment and thought content.  Rest of physical exam deferred due to type of encounter  Assessment and Plan:  Pregnancy: G2P1001 at [redacted]w[redacted]d 1. Supervision of other normal pregnancy, antepartum  - Babyscripts Schedule Optimization - Enroll Patient in Babyscripts - [place] MFM OB COMP + 14 WK; Future  Preterm labor symptoms and general obstetric precautions including but not limited to vaginal bleeding, contractions, leaking of fluid and fetal movement were reviewed in detail with the patient.  I discussed the assessment and treatment plan with the patient. The patient was provided an opportunity to ask  questions and all were answered. The patient agreed with the plan and demonstrated an understanding of the instructions. The patient was advised to call back or seek an in-person office evaluation/go to [name] at [facility] for any urgent or concerning symptoms. Please refer to After Visit Summary for other counseling recommendations.   I provided 10 minutes of non-face-to-face time during this encounter.  Return for needs labs within 2 weeks.  Future Appointments  Date Time Provider Department Center  08/02/2018  1:00 PM WH-MFC [place] 3 WH-MFCUS MFC-US    Scheryl Darter, MD Center for [facility], [facility]

## 2018-08-02 ENCOUNTER — Other Ambulatory Visit: Payer: Managed Care, Other (non HMO)

## 2018-08-02 ENCOUNTER — Ambulatory Visit (HOSPITAL_COMMUNITY)
Admission: RE | Admit: 2018-08-02 | Discharge: 2018-08-02 | Disposition: A | Discharge status: Home / Self Care | Payer: Managed Care, Other (non HMO) | Source: Ambulatory Visit | Attending: Obstetrics and Gynecology | Admitting: Obstetrics and Gynecology

## 2018-08-02 ENCOUNTER — Other Ambulatory Visit: Payer: Self-pay

## 2018-08-02 VITALS — Wt 164.3 lb

## 2018-08-02 DIAGNOSIS — Z3A19 19 weeks gestation of pregnancy: Secondary | ICD-10-CM

## 2018-08-02 DIAGNOSIS — Z3480 Encounter for supervision of other normal pregnancy, unspecified trimester: Secondary | ICD-10-CM

## 2018-08-02 DIAGNOSIS — Z363 Encounter for antenatal screening for malformations: Secondary | ICD-10-CM | POA: Diagnosis not present

## 2018-08-04 LAB — AFP, SERUM, OPEN SPINA BIFIDA
AFP MoM: 1.05
AFP Value: 57.3 ng/mL
Gest. Age on Collection Date: 19.6 weeks
Maternal Age At EDD: 30.5 yr
OSBR Risk 1 IN: 10000
Test Results:: NEGATIVE
Weight: 164 [lb_av]

## 2018-08-10 ENCOUNTER — Encounter: Payer: Self-pay | Admitting: Obstetrics & Gynecology

## 2018-08-10 DIAGNOSIS — R87611 Atypical squamous cells cannot exclude high grade squamous intraepithelial lesion on cytologic smear of cervix (ASC-H): Secondary | ICD-10-CM

## 2018-08-10 DIAGNOSIS — Z3480 Encounter for supervision of other normal pregnancy, unspecified trimester: Secondary | ICD-10-CM

## 2018-08-13 DIAGNOSIS — R87611 Atypical squamous cells cannot exclude high grade squamous intraepithelial lesion on cytologic smear of cervix (ASC-H): Secondary | ICD-10-CM | POA: Insufficient documentation

## 2018-08-16 ENCOUNTER — Encounter: Payer: Self-pay | Admitting: Obstetrics & Gynecology

## 2018-08-16 DIAGNOSIS — Z3480 Encounter for supervision of other normal pregnancy, unspecified trimester: Secondary | ICD-10-CM

## 2018-08-16 DIAGNOSIS — D563 Thalassemia minor: Secondary | ICD-10-CM | POA: Insufficient documentation

## 2018-08-27 ENCOUNTER — Encounter: Payer: Self-pay | Admitting: Obstetrics & Gynecology

## 2018-08-29 ENCOUNTER — Encounter: Payer: Managed Care, Other (non HMO) | Admitting: Obstetrics & Gynecology

## 2018-09-03 ENCOUNTER — Encounter: Payer: Self-pay | Admitting: Obstetrics and Gynecology

## 2018-09-03 ENCOUNTER — Encounter: Payer: Managed Care, Other (non HMO) | Admitting: Obstetrics

## 2018-09-03 ENCOUNTER — Ambulatory Visit (INDEPENDENT_AMBULATORY_CARE_PROVIDER_SITE_OTHER): Payer: Managed Care, Other (non HMO) | Admitting: Obstetrics and Gynecology

## 2018-09-03 DIAGNOSIS — Z3480 Encounter for supervision of other normal pregnancy, unspecified trimester: Secondary | ICD-10-CM

## 2018-09-03 DIAGNOSIS — Z3482 Encounter for supervision of other normal pregnancy, second trimester: Secondary | ICD-10-CM

## 2018-09-03 DIAGNOSIS — D563 Thalassemia minor: Secondary | ICD-10-CM

## 2018-09-03 DIAGNOSIS — Z3A24 24 weeks gestation of pregnancy: Secondary | ICD-10-CM

## 2018-09-03 NOTE — Progress Notes (Signed)
Pt is unable to take BP currently, does not have cuff availabe right now.

## 2018-09-03 NOTE — Progress Notes (Signed)
   TELEHEALTH VIRTUAL OBSTETRICS VISIT ENCOUNTER NOTE  I connected with Amber Lambert on 09/03/18 at  2:45 PM EDT by telephone at home and verified that I am speaking with the correct person using two identifiers.   I discussed the limitations, risks, security and privacy concerns of performing an evaluation and management service by telephone and the availability of in person appointments. I also discussed with the patient that there may be a patient responsible charge related to this service. The patient expressed understanding and agreed to proceed.  Subjective:  Amber Lambert is a 28 y.o. G2P1001 at [redacted]w[redacted]d being followed for ongoing prenatal care.  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  Patient reports no complaints. Reports fetal movement. Denies any contractions, bleeding or leaking of fluid.   The following portions of the patient's history were reviewed and updated as appropriate: allergies, current medications, past family history, past medical history, past social history, past surgical history and problem list.   Objective:   General:  Alert, oriented and cooperative.   Mental Status: Normal mood and affect perceived. Normal judgment and thought content.  Rest of physical exam deferred due to type of encounter  Assessment and Plan:  Pregnancy: G2P1001 at [redacted]w[redacted]d 1. Supervision of other normal pregnancy, antepartum Patient is doing well without complaints Third trimester labs next visit  2. Alpha thalassemia silent carrier   Preterm labor symptoms and general obstetric precautions including but not limited to vaginal bleeding, contractions, leaking of fluid and fetal  movement were reviewed in detail with the patient.  I discussed the assessment and treatment plan with the patient. The patient was provided an opportunity to ask questions and all were answered. The patient agreed with the plan and demonstrated an understanding of the instructions. The patient was advised to call back or seek an in-person office evaluation/go to [name] at [facility] for any urgent or concerning symptoms. Please refer to After Visit Summary for other counseling recommendations.   I provided 11 minutes of non-face-to-face time during this encounter.  Return in about 4 weeks (around 10/01/2018) for IN PERSON, ROB, 2 hr glucola next visit.  Future Appointments  Date Time Provider [place]  09/03/2018  2:45 PM Agape Hardiman, Vickii Chafe, MD [place] None    Mora Bellman, MD Center for [employer], [place]

## 2018-09-26 ENCOUNTER — Telehealth: Payer: Self-pay

## 2018-09-26 NOTE — Telephone Encounter (Signed)
Pt called stating that she has been having headaches off and on for the past 3 days. I advised patient to check her BP while on the phone, BP reads: 108/66. Pt denies any visual changes. Pt reports good fetal movement. I advised pt to try tylenol to see if that helps with her symptoms. I also advised pt she is safe to take allergy medicine (Zyrtec/claritin/or benadryl) as this could be allergies. Advised pt to check BP periodically and let us know if it becomes elevated or go to the hospital. Pt verbalizes understanding.

## 2018-09-29 ENCOUNTER — Inpatient Hospital Stay (HOSPITAL_COMMUNITY)
Admission: AD | Admit: 2018-09-29 | Discharge: 2018-09-29 | Disposition: A | Discharge status: Home / Self Care | Payer: Managed Care, Other (non HMO) | Attending: Obstetrics & Gynecology | Admitting: Obstetrics & Gynecology

## 2018-09-29 ENCOUNTER — Other Ambulatory Visit: Payer: Self-pay

## 2018-09-29 DIAGNOSIS — Z3A28 28 weeks gestation of pregnancy: Secondary | ICD-10-CM | POA: Diagnosis not present

## 2018-09-29 DIAGNOSIS — O26893 Other specified pregnancy related conditions, third trimester: Secondary | ICD-10-CM | POA: Diagnosis not present

## 2018-09-29 DIAGNOSIS — R51 Headache: Secondary | ICD-10-CM | POA: Diagnosis present

## 2018-09-29 DIAGNOSIS — Z880 Allergy status to penicillin: Secondary | ICD-10-CM | POA: Diagnosis not present

## 2018-09-29 DIAGNOSIS — Z3493 Encounter for supervision of normal pregnancy, unspecified, third trimester: Secondary | ICD-10-CM

## 2018-09-29 MED ORDER — BUTALBITAL-APAP-CAFFEINE 50-325-40 MG PO TABS: 2.0000 | ORAL_TABLET | Freq: Four times a day (QID) | ORAL | 0 refills | Status: DC | PRN

## 2018-09-29 MED ORDER — BUTALBITAL-APAP-CAFFEINE 50-325-40 MG PO TABS: 2.0000 | ORAL_TABLET | Freq: Four times a day (QID) | ORAL | 0 refills | Status: AC | PRN

## 2018-09-29 NOTE — MAU Note (Addendum)
Amber Lambert is a 28 y.o. at [redacted]w[redacted]d here in [name] reporting:  headache Onset of complaint: ongoing since Tuesday. Has tried tylenol at home with no relief. Denies vision changes, swelling, or epigastric pain. Pain score: 4/10 Vitals:   09/29/18 1527  BP: 131/78  Pulse: 82  Resp: 16  Temp: 98 F (36.7 C)  SpO2: 99%    FHT: 144 via doppler Lab orders placed from triage: none Patient tearful during triage. When RN asked what was wrong patient verbalized "Im just frustrated but I understand". Patient had previously talked with CNM prior to nurse encounter. Patient is with young child. Reports she does not have anyone available to watch him and is driving herself.

## 2018-09-29 NOTE — MAU Provider Note (Signed)
History     CSN: [id number]  Arrival date and time: 09/29/18 1504   First Provider Initiated Contact with Patient 09/29/18 1516      Chief Complaint  Patient presents with  . Headache   HPI Amber Lambert is a 28 y.o. G2P1001 at 2865w1d who presents to [name] with chief complaint of headache. This is a new problem, onset two days ago. Patient rates her pain as 4/10. Patient's pain is located behind her right eye. Her pain does not radiate. She denies aggravating or alleviating factors. She took 1 extra strength Tylenol early this morning but did not experience relief.  Patient has her 30 year old son with her. She states her husband and her sister are her only options for child care and they are both out of town.  Patient denies history of headaches, migraines or elevated blood pressure. She denies vaginal bleeding, leaking of fluid, decreased fetal movement, fever, falls, or recent illness.   OB History    Gravida  2   Para  1   Term  1   Preterm      AB      Living  1     SAB      TAB      Ectopic      Multiple      Live Births  1           Past Medical History:  Diagnosis Date  . Anemia   . Trichomonas contact, treated     Past Surgical History:  Procedure Laterality Date  . addenoidectomy    . TONSILLECTOMY    . WISDOM TOOTH EXTRACTION      Family History  Problem Relation Age of Onset  . Hypertension Paternal Grandfather   . Diabetes Paternal Grandfather   . Hypertension Father     Social History   Tobacco Use  . Smoking status: Never Smoker  . Smokeless tobacco: Never Used  Substance Use Topics  . Alcohol use: No  . Drug use: No    Allergies:  Allergies  Allergen Reactions  . Penicillins Rash    As a child    Medications Prior to Admission  Medication Sig Dispense Refill Last Dose  . Doxylamine-Pyridoxine (DICLEGIS) 10-10 MG TBEC Take 2 tabs at bedtime. If needed, add another tab in the morning. If needed, add another tab in  the afternoon, up to 4 tabs/day. 100 tablet 5   . ferrous sulfate 325 (65 FE) MG tablet Take 325 mg by mouth daily with breakfast.     . ibuprofen (ADVIL,MOTRIN) 600 MG tablet Take 1 tablet (600 mg total) by mouth every 6 (six) hours. (Patient not taking: Reported on 05/29/2018) 30 tablet 0   . Prenat w/o A Vit-FeFum-FePo-FA (CONCEPT OB) 130-92.4-1 MG CAPS Take 1 capsule by mouth daily. 30 capsule 11   . Prenatal Vit-Fe Fumarate-FA (PRENATAL MULTIVITAMIN) TABS tablet Take 1 tablet by mouth daily at 12 noon.       Review of Systems  Constitutional: Negative for chills, fatigue and fever.  Eyes: Negative for visual disturbance.  Respiratory: Negative for shortness of breath.   Gastrointestinal: Negative for abdominal pain.  Genitourinary: Negative for vaginal bleeding.  Musculoskeletal: Negative for back pain.  Neurological: Positive for headaches. Negative for dizziness, seizures, syncope and weakness.  All other systems reviewed and are negative.  Physical Exam   Last menstrual period 03/16/2018, unknown if currently breastfeeding.  Physical Exam  Nursing note and vitals reviewed. Constitutional: She  is oriented to person, place, and time. She appears well-developed and well-nourished.  Cardiovascular: Normal rate.  Respiratory: Effort normal.  GI:  Gravid  Neurological: She is alert and oriented to person, place, and time. She has normal strength. No sensory deficit. She displays a negative Romberg sign.  Skin: Skin is warm and dry.  Psychiatric: She has a normal mood and affect. Her behavior is normal. Judgment and thought content normal.    [name] Course/MDM  Procedures  --CNM in triage to explain normal plan of care for managing headaches in pregnancy including medications and possible IV fluids --Visitor policy reviewed with patient, explained that she cannot progress to a room without child care for her 20 year old and RNs cannot be asked to provide child care. Patient  restates that she does not have anyone she can call and she is not comfortable with the 2 hours of oversight offered by CPS. --Normotensive, no severe signs or symptoms. Denies obstetric complaints --Next appointment is Monday 07/06. Patient offered short course of Fioricet to address pain since she cannot stay for treatment in [name]. Patient verbalizes agreement with this plan of care.  Patient Vitals for the past 24 hrs:  BP Temp Temp src Pulse Resp SpO2  09/29/18 1527 131/78 98 F (36.7 C) Oral 82 16 99 %   Meds ordered this encounter  Medications  . butalbital-acetaminophen-caffeine (FIORICET) 50-325-40 MG tablet    Sig: Take 2 tablets by mouth every 6 (six) hours as needed for headache.    Dispense:  20 tablet    Refill:  0    Order Specific Question:   Supervising Provider    Answer:   Verita Schneiders A [[id number]]   Assessment and Plan  --28 y.o. G2P1001 at [redacted]w[redacted]d  --Headache in pregnancy --Rx Fioricet, home headache treatments reviewed with patient --Discharge home in stable condition. Patient encouraged to return to [name] for pain not addressed by Fioricet once she obtains child care.  F/U: Next OB appt [facility] 10/01/2018  Darlina Rumpf, [place] 09/29/2018, 4:06 PM

## 2018-09-29 NOTE — Discharge Instructions (Signed)
General Headache Without Cause °A headache is pain or discomfort that is felt around the head or neck area. There are many causes and types of headaches. In some cases, the cause may not be found. °Follow these instructions at home: °Watch your condition for any changes. Let your doctor know about them. Take these steps to help with your condition: °Managing pain ° °  ° °· Take over-the-counter and prescription medicines only as told by your doctor. °· Lie down in a dark, quiet room when you have a headache. °· If told, put ice on your head and neck area: °? Put ice in a plastic bag. °? Place a towel between your skin and the bag. °? Leave the ice on for 20 minutes, 2-3 times per day. °· If told, put heat on the affected area. Use the heat source that your doctor recommends, such as a moist heat pack or a heating pad. °? Place a towel between your skin and the heat source. °? Leave the heat on for 20-30 minutes. °? Remove the heat if your skin turns bright red. This is very important if you are unable to feel pain, heat, or cold. You may have a greater risk of getting burned. °· Keep lights dim if bright lights bother you or make your headaches worse. °Eating and drinking °· Eat meals on a regular schedule. °· If you drink alcohol: °? Limit how much you use to: °§ 0-1 drink a day for women. °§ 0-2 drinks a day for men. °? Be aware of how much alcohol is in your drink. In the [place] one drink equals one 12 oz bottle of beer (355 mL), one 5 oz glass of wine (148 mL), or one 1½ oz glass of hard liquor (44 mL). °· Stop drinking caffeine, or reduce how much caffeine you drink. °General instructions ° °· Keep a journal to find out if certain things bring on headaches. For example, write down: °? What you eat and drink. °? How much sleep you get. °? Any change to your diet or medicines. °· Get a massage or try other ways to relax. °· Limit stress. °· Sit up straight. Do not tighten (tense) your muscles. °· Do not use any  products that contain nicotine or tobacco. This includes cigarettes, e-cigarettes, and chewing tobacco. If you need help quitting, ask your doctor. °· Exercise regularly as told by your doctor. °· Get enough sleep. This often means 7-9 hours of sleep each night. °· Keep all follow-up visits as told by your doctor. This is important. °Contact a doctor if: °· Your symptoms are not helped by medicine. °· You have a headache that feels different than the other headaches. °· You feel sick to your stomach (nauseous) or you throw up (vomit). °· You have a fever. °Get help right away if: °· Your headache gets very bad quickly. °· Your headache gets worse after a lot of physical activity. °· You keep throwing up. °· You have a stiff neck. °· You have trouble seeing. °· You have trouble speaking. °· You have pain in the eye or ear. °· Your muscles are weak or you lose muscle control. °· You lose your balance or have trouble walking. °· You feel like you will pass out (faint) or you pass out. °· You are mixed up (confused). °· You have a seizure. °Summary °· A headache is pain or discomfort that is felt around the head or neck area. °· There are many causes and   types of headaches. In some cases, the cause may not be found. °· Keep a journal to help find out what causes your headaches. Watch your condition for any changes. Let your doctor know about them. °· Contact a doctor if you have a headache that is different from usual, or if your headache is not helped by medicine. °· Get help right away if your headache gets very bad, you throw up, you have trouble seeing, you lose your balance, or you have a seizure. °This information is not intended to replace advice given to you by your health care provider. Make sure you discuss any questions you have with your health care provider. °Document Released: 12/22/2007 Document Revised: 10/02/2017 Document Reviewed: 10/02/2017 °Elsevier Patient Education © 2020 [employer] ° °

## 2018-10-01 ENCOUNTER — Other Ambulatory Visit: Payer: Self-pay

## 2018-10-01 ENCOUNTER — Ambulatory Visit (INDEPENDENT_AMBULATORY_CARE_PROVIDER_SITE_OTHER): Payer: Managed Care, Other (non HMO) | Admitting: Obstetrics & Gynecology

## 2018-10-01 ENCOUNTER — Other Ambulatory Visit: Payer: Managed Care, Other (non HMO)

## 2018-10-01 VITALS — BP 113/71 | HR 85 | Temp 97.1°F | Wt 174.7 lb

## 2018-10-01 DIAGNOSIS — Z3480 Encounter for supervision of other normal pregnancy, unspecified trimester: Secondary | ICD-10-CM

## 2018-10-01 DIAGNOSIS — Z3A28 28 weeks gestation of pregnancy: Secondary | ICD-10-CM

## 2018-10-01 DIAGNOSIS — R51 Headache: Secondary | ICD-10-CM

## 2018-10-01 DIAGNOSIS — O26893 Other specified pregnancy related conditions, third trimester: Secondary | ICD-10-CM

## 2018-10-01 NOTE — Patient Instructions (Signed)
Return to office for any scheduled appointments. Call the office or go to the [name] at [facility] [place] if:  You begin to have strong, frequent contractions  Your water breaks.  Sometimes it is a big gush of fluid, sometimes it is just a trickle that keeps getting your panties wet or running down your legs  You have vaginal bleeding.  It is normal to have a small amount of spotting if your cervix was checked.   You do not feel your baby moving like normal.  If you do not, get something to eat and drink and lay down and focus on feeling your baby move.   If your baby is still not moving like normal, you should call the office or go to [name].  Any other obstetric concerns.   Third Trimester of Pregnancy The third trimester is from week 28 through week 40 (months 7 through 9). The third trimester is a time when the unborn baby (fetus) is growing rapidly. At the end of the ninth month, the fetus is about 20 inches in length and weighs 6-10 pounds. Body changes during your third trimester Your body will continue to go through many changes during pregnancy. The changes vary from woman to woman. During the third trimester:  Your weight will continue to increase. You can expect to gain 25-35 pounds (11-16 kg) by the end of the pregnancy.  You may begin to get stretch marks on your hips, abdomen, and breasts.  You may urinate more often because the fetus is moving lower into your pelvis and pressing on your bladder.  You may develop or continue to have heartburn. This is caused by increased hormones that slow down muscles in the digestive tract.  You may develop or continue to have constipation because increased hormones slow digestion and cause the muscles that push waste through your intestines to relax.  You may develop hemorrhoids. These are swollen veins (varicose veins) in the rectum that can itch or be painful.  You may develop swollen, bulging veins (varicose veins)  in your legs.  You may have increased body aches in the pelvis, back, or thighs. This is due to weight gain and increased hormones that are relaxing your joints.  You may have changes in your hair. These can include thickening of your hair, rapid growth, and changes in texture. Some women also have hair loss during or after pregnancy, or hair that feels dry or thin. Your hair will most likely return to normal after your baby is born.  Your breasts will continue to grow and they will continue to become tender. A yellow fluid (colostrum) may leak from your breasts. This is the first milk you are producing for your baby.  Your belly button may stick out.  You may notice more swelling in your hands, face, or ankles.  You may have increased tingling or numbness in your hands, arms, and legs. The skin on your belly may also feel numb.  You may feel short of breath because of your expanding uterus.  You may have more problems sleeping. This can be caused by the size of your belly, increased need to urinate, and an increase in your body's metabolism.  You may notice the fetus "dropping," or moving lower in your abdomen (lightening).  You may have increased vaginal discharge.  You may notice your joints feel loose and you may have pain around your pelvic bone. What to expect at prenatal visits You will have   prenatal exams every 2 weeks until week 36. Then you will have weekly prenatal exams. During a routine prenatal visit:  You will be weighed to make sure you and the baby are growing normally.  Your blood pressure will be taken.  Your abdomen will be measured to track your baby's growth.  The fetal heartbeat will be listened to.  Any test results from the previous visit will be discussed.  You may have a cervical check near your due date to see if your cervix has softened or thinned (effaced).  You will be tested for Group B streptococcus. This happens between 35 and 37 weeks. Your  health care provider may ask you:  What your birth plan is.  How you are feeling.  If you are feeling the baby move.  If you have had any abnormal symptoms, such as leaking fluid, bleeding, severe headaches, or abdominal cramping.  If you are using any tobacco products, including cigarettes, chewing tobacco, and electronic cigarettes.  If you have any questions. Other tests or screenings that may be performed during your third trimester include:  Blood tests that check for low iron levels (anemia).  Fetal testing to check the health, activity level, and growth of the fetus. Testing is done if you have certain medical conditions or if there are problems during the pregnancy.  Nonstress test (NST). This test checks the health of your baby to make sure there are no signs of problems, such as the baby not getting enough oxygen. During this test, a belt is placed around your belly. The baby is made to move, and its heart rate is monitored during movement. What is false labor? False labor is a condition in which you feel small, irregular tightenings of the muscles in the womb (contractions) that usually go away with rest, changing position, or drinking water. These are called Braxton Hicks contractions. Contractions may last for hours, days, or even weeks before true labor sets in. If contractions come at regular intervals, become more frequent, increase in intensity, or become painful, you should see your health care provider. What are the signs of labor?  Abdominal cramps.  Regular contractions that start at 10 minutes apart and become stronger and more frequent with time.  Contractions that start on the top of the uterus and spread down to the lower abdomen and back.  Increased pelvic pressure and dull back pain.  A watery or bloody mucus discharge that comes from the vagina.  Leaking of amniotic fluid. This is also known as your "water breaking." It could be a slow trickle or a gush.  Let your health care provider know if it has a color or strange odor. If you have any of these signs, call your health care provider right away, even if it is before your due date. Follow these instructions at home: Medicines  Follow your health care provider's instructions regarding medicine use. Specific medicines may be either safe or unsafe to take during pregnancy.  Take a prenatal vitamin that contains at least 600 micrograms (mcg) of folic acid.  If you develop constipation, try taking a stool softener if your health care provider approves. Eating and drinking   Eat a balanced diet that includes fresh fruits and vegetables, whole grains, good sources of protein such as meat, eggs, or tofu, and low-fat dairy. Your health care provider will help you determine the amount of weight gain that is right for you.  Avoid raw meat and uncooked cheese. These carry germs that   can cause birth defects in the baby.  If you have low calcium intake from food, talk to your health care provider about whether you should take a daily calcium supplement.  Eat four or five small meals rather than three large meals a day.  Limit foods that are high in fat and processed sugars, such as fried and sweet foods.  To prevent constipation: ? Drink enough fluid to keep your urine clear or pale yellow. ? Eat foods that are high in fiber, such as fresh fruits and vegetables, whole grains, and beans. Activity  Exercise only as directed by your health care provider. Most women can continue their usual exercise routine during pregnancy. Try to exercise for 30 minutes at least 5 days a week. Stop exercising if you experience uterine contractions.  Avoid heavy lifting.  Do not exercise in extreme heat or humidity, or at high altitudes.  Wear low-heel, comfortable shoes.  Practice good posture.  You may continue to have sex unless your health care provider tells you otherwise. Relieving pain and  discomfort  Take frequent breaks and rest with your legs elevated if you have leg cramps or low back pain.  Take warm sitz baths to soothe any pain or discomfort caused by hemorrhoids. Use hemorrhoid cream if your health care provider approves.  Wear a good support bra to prevent discomfort from breast tenderness.  If you develop varicose veins: ? Wear support pantyhose or compression stockings as told by your healthcare provider. ? Elevate your feet for 15 minutes, 3-4 times a day. Prenatal care  Write down your questions. Take them to your prenatal visits.  Keep all your prenatal visits as told by your health care provider. This is important. Safety  Wear your seat belt at all times when driving.  Make a list of emergency phone numbers, including numbers for family, friends, the hospital, and police and fire departments. General instructions  Avoid cat litter boxes and soil used by cats. These carry germs that can cause birth defects in the baby. If you have a cat, ask someone to clean the litter box for you.  Do not travel far distances unless it is absolutely necessary and only with the approval of your health care provider.  Do not use hot tubs, steam rooms, or saunas.  Do not drink alcohol.  Do not use any products that contain nicotine or tobacco, such as cigarettes and e-cigarettes. If you need help quitting, ask your health care provider.  Do not use any medicinal herbs or unprescribed drugs. These chemicals affect the formation and growth of the baby.  Do not douche or use tampons or scented sanitary pads.  Do not cross your legs for long periods of time.  To prepare for the arrival of your baby: ? Take prenatal classes to understand, practice, and ask questions about labor and delivery. ? Make a trial run to the hospital. ? Visit the hospital and tour the maternity area. ? Arrange for maternity or paternity leave through employers. ? Arrange for family and  friends to take care of pets while you are in the hospital. ? Purchase a rear-facing car seat and make sure you know how to install it in your car. ? Pack your hospital bag. ? Prepare the baby's nursery. Make sure to remove all pillows and stuffed animals from the baby's crib to prevent suffocation.  Visit your dentist if you have not gone during your pregnancy. Use a soft toothbrush to brush your teeth and be   gentle when you floss. Contact a health care provider if:  You are unsure if you are in labor or if your water has broken.  You become dizzy.  You have mild pelvic cramps, pelvic pressure, or nagging pain in your abdominal area.  You have lower back pain.  You have persistent nausea, vomiting, or diarrhea.  You have an unusual or bad smelling vaginal discharge.  You have pain when you urinate. Get help right away if:  Your water breaks before 37 weeks.  You have regular contractions less than 5 minutes apart before 37 weeks.  You have a fever.  You are leaking fluid from your vagina.  You have spotting or bleeding from your vagina.  You have severe abdominal pain or cramping.  You have rapid weight loss or weight gain.  You have shortness of breath with chest pain.  You notice sudden or extreme swelling of your face, hands, ankles, feet, or legs.  Your baby makes fewer than 10 movements in 2 hours.  You have severe headaches that do not go away when you take medicine.  You have vision changes. Summary  The third trimester is from week 28 through week 40, months 7 through 9. The third trimester is a time when the unborn baby (fetus) is growing rapidly.  During the third trimester, your discomfort may increase as you and your baby continue to gain weight. You may have abdominal, leg, and back pain, sleeping problems, and an increased need to urinate.  During the third trimester your breasts will keep growing and they will continue to become tender. A yellow  fluid (colostrum) may leak from your breasts. This is the first milk you are producing for your baby.  False labor is a condition in which you feel small, irregular tightenings of the muscles in the womb (contractions) that eventually go away. These are called Braxton Hicks contractions. Contractions may last for hours, days, or even weeks before true labor sets in.  Signs of labor can include: abdominal cramps; regular contractions that start at 10 minutes apart and become stronger and more frequent with time; watery or bloody mucus discharge that comes from the vagina; increased pelvic pressure and dull back pain; and leaking of amniotic fluid. This information is not intended to replace advice given to you by your health care provider. Make sure you discuss any questions you have with your health care provider. Document Released: 03/08/2001 Document Revised: 07/05/2018 Document Reviewed: 04/19/2016 Elsevier Patient Education  2020 [employer]    

## 2018-10-01 NOTE — Progress Notes (Signed)
ROB/GTT.  TDAP given in LD, tolerated well.  C/o Headaches, which has since been treated at [facility], HA stopped on Sunday.

## 2018-10-01 NOTE — Progress Notes (Signed)
   PRENATAL VISIT NOTE  Subjective:  Amber Lambert is a 28 y.o. G2P1001 at [redacted]w[redacted]d being seen today for ongoing prenatal care.  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  Patient reports headaches in last week, not alleviated by Tylenol. Fioricet was called in for her but headaches got better on their own before she took them.Reports adequate hydration.   Contractions: Not present. Vag. Bleeding: None.  Movement: Present. Denies leaking of fluid.   The following portions of the patient's history were reviewed and updated as appropriate: allergies, current medications, past family history, past medical history, past social history, past surgical history and problem list.   Objective:   Vitals:   10/01/18 0851  BP: 113/71  Pulse: 85  Temp: (!) 97.1 F (36.2 C)  Weight: 174 lb 11.2 oz (79.2 kg)    Fetal Status: Fetal Heart Rate (bpm): 149   Movement: Present     General:  Alert, oriented and cooperative. Patient is in no acute distress.  Skin: Skin is warm and dry. No rash noted.   Cardiovascular: Normal heart rate noted  Respiratory: Normal respiratory effort, no problems with respiration noted  Abdomen: Soft, gravid, appropriate for gestational age.  Pain/Pressure: Present     Pelvic: Cervical exam deferred        Extremities: Normal range of motion.  Edema: Trace  Mental Status: Normal mood and affect. Normal behavior. Normal judgment and thought content.   Assessment and Plan:  Pregnancy: G2P1001 at [redacted]w[redacted]d 1. Headache in pregnancy, third trimester Resolved for now. Continue adequate hydration, Fioricet as needed.  2. Supervision of other normal pregnancy, antepartum Third  trimester labs, Tdap today. - Glucose Tolerance, 2 Hours w/1 Hour - CBC - HIV antibody (with reflex) - RPR - Tdap vaccine greater than or equal to 7yo IM Preterm labor symptoms and general obstetric precautions including but not limited to vaginal bleeding, contractions, leaking of fluid and fetal movement were reviewed in detail with the patient. Please refer to After Visit Summary for other counseling recommendations.   Return in about 2 weeks (around 10/15/2018) for Virtual OB Visit.  Future Appointments  Date Time Provider [place]  10/01/2018  9:45 AM Anyanwu, Sallyanne Havers, MD [facility] None  10/15/2018  2:15 PM Constant, Vickii Chafe, MD [place] None    Verita Schneiders, MD

## 2018-10-02 LAB — CBC
Hematocrit: 35.2 % (ref 34.0–46.6)
Hemoglobin: 11.4 g/dL (ref 11.1–15.9)
MCH: 27.7 pg (ref 26.6–33.0)
MCHC: 32.4 g/dL (ref 31.5–35.7)
MCV: 85 fL (ref 79–97)
Platelets: 175 10*3/uL (ref 150–450)
RBC: 4.12 x10E6/uL (ref 3.77–5.28)
RDW: 13.1 % (ref 11.7–15.4)
WBC: 8.2 10*3/uL (ref 3.4–10.8)

## 2018-10-02 LAB — GLUCOSE TOLERANCE, 2 HOURS W/ 1HR
Glucose, 1 hour: 88 mg/dL (ref 65–179)
Glucose, 2 hour: 79 mg/dL (ref 65–152)
Glucose, Fasting: 70 mg/dL (ref 65–91)

## 2018-10-02 LAB — RPR: RPR Ser Ql: NONREACTIVE

## 2018-10-02 LAB — HIV ANTIBODY (ROUTINE TESTING W REFLEX): HIV Screen 4th Generation wRfx: NONREACTIVE

## 2018-10-15 ENCOUNTER — Telehealth (INDEPENDENT_AMBULATORY_CARE_PROVIDER_SITE_OTHER): Payer: Managed Care, Other (non HMO) | Admitting: Obstetrics and Gynecology

## 2018-10-15 ENCOUNTER — Encounter: Payer: Self-pay | Admitting: Obstetrics and Gynecology

## 2018-10-15 VITALS — BP 111/77

## 2018-10-15 DIAGNOSIS — Z3483 Encounter for supervision of other normal pregnancy, third trimester: Secondary | ICD-10-CM

## 2018-10-15 DIAGNOSIS — Z3A30 30 weeks gestation of pregnancy: Secondary | ICD-10-CM

## 2018-10-15 DIAGNOSIS — Z3480 Encounter for supervision of other normal pregnancy, unspecified trimester: Secondary | ICD-10-CM

## 2018-10-15 NOTE — Progress Notes (Signed)
Pt states some lower back pain and pelvic pressure, recommend maternity support belt.

## 2018-10-15 NOTE — Progress Notes (Signed)
   TELEHEALTH OBSTETRICS PRENATAL VIRTUAL VIDEO VISIT ENCOUNTER NOTE  Provider location: [facility] at [place]   I connected with Sheryn Bison on 10/15/18 at  2:15 PM EDT by MyChart Video Encounter at home and verified that I am speaking with the correct person using two identifiers.   I discussed the limitations, risks, security and privacy concerns of performing an evaluation and management service virtually and the availability of in person appointments. I also discussed with the patient that there may be a patient responsible charge related to this service. The patient expressed understanding and agreed to proceed. Subjective:  Amber Lambert is a 28 y.o. G2P1001 at [redacted]w[redacted]d being seen today for ongoing prenatal care.  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  Patient reports backache.  Contractions: Not present. Vag. Bleeding: None.  Movement: Present. Denies any leaking of fluid.   The following portions of the patient's history were reviewed and updated as appropriate: allergies, current medications, past family history, past medical history, past social history, past surgical history and problem list.   Objective:   Vitals:   10/15/18 1402  BP: 111/77    Fetal Status:     Movement: Present     General:  Alert, oriented and cooperative. Patient is in no acute distress.  Respiratory: Normal respiratory effort, no problems with respiration noted  Mental Status: Normal mood and affect. Normal behavior. Normal judgment and thought content.  Rest of physical exam deferred due to type of encounter  Imaging: No results found.  Assessment and Plan:  Pregnancy:  G2P1001 at [redacted]w[redacted]d 1. Supervision of other normal pregnancy, antepartum Patient is doing well Discussed the benefits of back exercises and the use of a maternity belt. Patient will come to the office to pick it up at her convenience  Preterm labor symptoms and general obstetric precautions including but not limited to vaginal bleeding, contractions, leaking of fluid and fetal movement were reviewed in detail with the patient. I discussed the assessment and treatment plan with the patient. The patient was provided an opportunity to ask questions and all were answered. The patient agreed with the plan and demonstrated an understanding of the instructions. The patient was advised to call back or seek an in-person office evaluation/go to [name] at [facility] for any urgent or concerning symptoms. Please refer to After Visit Summary for other counseling recommendations.   I provided 11 minutes of face-to-face time during this encounter.  Return in about 2 weeks (around 10/29/2018) for [facility] [place].  Future Appointments  Date Time Provider [place]  10/15/2018  2:15 PM Katrine Radich, Vickii Chafe, MD [place] None    Mora Bellman, MD [facility] [place]

## 2018-10-22 ENCOUNTER — Other Ambulatory Visit: Payer: Self-pay

## 2018-10-22 DIAGNOSIS — Z3480 Encounter for supervision of other normal pregnancy, unspecified trimester: Secondary | ICD-10-CM

## 2018-10-22 MED ORDER — MISC. DEVICES MISC: 0 refills | Status: AC

## 2018-10-29 ENCOUNTER — Telehealth (INDEPENDENT_AMBULATORY_CARE_PROVIDER_SITE_OTHER): Payer: Managed Care, Other (non HMO) | Admitting: Obstetrics & Gynecology

## 2018-10-29 VITALS — BP 101/58 | HR 84

## 2018-10-29 DIAGNOSIS — O26893 Other specified pregnancy related conditions, third trimester: Secondary | ICD-10-CM

## 2018-10-29 DIAGNOSIS — R87611 Atypical squamous cells cannot exclude high grade squamous intraepithelial lesion on cytologic smear of cervix (ASC-H): Secondary | ICD-10-CM

## 2018-10-29 DIAGNOSIS — O9989 Other specified diseases and conditions complicating pregnancy, childbirth and the puerperium: Secondary | ICD-10-CM

## 2018-10-29 DIAGNOSIS — D563 Thalassemia minor: Secondary | ICD-10-CM

## 2018-10-29 DIAGNOSIS — Z3480 Encounter for supervision of other normal pregnancy, unspecified trimester: Secondary | ICD-10-CM

## 2018-10-29 DIAGNOSIS — Z3A32 32 weeks gestation of pregnancy: Secondary | ICD-10-CM

## 2018-10-29 NOTE — Progress Notes (Signed)
[employer] VIRTUAL VIDEO VISIT ENCOUNTER NOTE  Provider location: [facility] at [place]   I connected with Amber Lambert on 10/29/18 at  2:45 PM EDT by MyChart Video Encounter at home and verified that I am speaking with the correct person using two identifiers.   I discussed the limitations, risks, security and privacy concerns of performing an evaluation and management service virtually and the availability of in person appointments. I also discussed with the patient that there may be a patient responsible charge related to this service. The patient expressed understanding and agreed to proceed. Subjective:  Amber Lambert is a 28 y.o. G2P1001 at [redacted]w[redacted]d being seen today for ongoing prenatal care.  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  Patient reports no complaints.  Contractions: Not present. Vag. Bleeding: None.  Movement: Present. Denies any leaking of fluid.   The following portions of the patient's history were reviewed and updated as appropriate: allergies, current medications, past family history, past medical history, past social history, past surgical history and problem list.   Objective:   Vitals:   10/29/18 1451  BP: (!) 101/58  Pulse: 84    Fetal Status:     Movement: Present     General:  Alert, oriented and cooperative. Patient is in no acute distress.  Respiratory: Normal respiratory effort, no problems with respiration noted  Mental Status: Normal mood and affect. Normal behavior. Normal judgment and thought content.  Rest of physical exam deferred due to type of encounter  Imaging: No results found.  Assessment  and Plan:  Pregnancy: G2P1001 at [redacted]w[redacted]d 1. Supervision of other normal pregnancy, antepartum No problems. Good FM   Does not want to do virtual visits.   2. Atypical squamous cells cannot exclude high grade squamous intraepithelial lesion on cytologic smear of cervix (ASC-H)  3. Alpha thalassemia silent carrier  Preterm labor symptoms and general obstetric precautions including but not limited to vaginal bleeding, contractions, leaking of fluid and fetal movement were reviewed in detail with the patient. I discussed the assessment and treatment plan with the patient. The patient was provided an opportunity to ask questions and all were answered. The patient agreed with the plan and demonstrated an understanding of the instructions. The patient was advised to call back or seek an in-person office evaluation/go to [name] at [facility] for any urgent or concerning symptoms. Please refer to After Visit Summary for other counseling recommendations.   I provided 8 minutes of face-to-face time during this encounter.  No follow-ups on file.  No future appointments.  Lavonia Drafts, MD [facility], [place]

## 2018-11-13 ENCOUNTER — Ambulatory Visit (INDEPENDENT_AMBULATORY_CARE_PROVIDER_SITE_OTHER): Payer: Managed Care, Other (non HMO) | Admitting: Medical

## 2018-11-13 ENCOUNTER — Encounter: Payer: Self-pay | Admitting: Medical

## 2018-11-13 ENCOUNTER — Other Ambulatory Visit: Payer: Self-pay

## 2018-11-13 VITALS — BP 114/71 | HR 73 | Wt 182.4 lb

## 2018-11-13 DIAGNOSIS — Z3A34 34 weeks gestation of pregnancy: Secondary | ICD-10-CM

## 2018-11-13 DIAGNOSIS — Z3480 Encounter for supervision of other normal pregnancy, unspecified trimester: Secondary | ICD-10-CM

## 2018-11-13 DIAGNOSIS — Z3483 Encounter for supervision of other normal pregnancy, third trimester: Secondary | ICD-10-CM

## 2018-11-13 NOTE — Patient Instructions (Signed)
Fetal Movement Counts Patient Name: ________________________________________________ Patient Due Date: ____________________ What is a fetal movement count?  A fetal movement count is the number of times that you feel your baby move during a certain amount of time. This may also be called a fetal kick count. A fetal movement count is recommended for every pregnant woman. You may be asked to start counting fetal movements as early as week 28 of your pregnancy. Pay attention to when your baby is most active. You may notice your baby's sleep and wake cycles. You may also notice things that make your baby move more. You should do a fetal movement count:  When your baby is normally most active.  At the same time each day. A good time to count movements is while you are resting, after having something to eat and drink. How do I count fetal movements? 1. Find a quiet, comfortable area. Sit, or lie down on your side. 2. Write down the date, the start time and stop time, and the number of movements that you felt between those two times. Take this information with you to your health care visits. 3. For 2 hours, count kicks, flutters, swishes, rolls, and jabs. You should feel at least 10 movements during 2 hours. 4. You may stop counting after you have felt 10 movements. 5. If you do not feel 10 movements in 2 hours, have something to eat and drink. Then, keep resting and counting for 1 hour. If you feel at least 4 movements during that hour, you may stop counting. Contact a health care provider if:  You feel fewer than 4 movements in 2 hours.  Your baby is not moving like he or she usually does. Date: ____________ Start time: ____________ Stop time: ____________ Movements: ____________ Date: ____________ Start time: ____________ Stop time: ____________ Movements: ____________ Date: ____________ Start time: ____________ Stop time: ____________ Movements: ____________ Date: ____________ Start time:  ____________ Stop time: ____________ Movements: ____________ Date: ____________ Start time: ____________ Stop time: ____________ Movements: ____________ Date: ____________ Start time: ____________ Stop time: ____________ Movements: ____________ Date: ____________ Start time: ____________ Stop time: ____________ Movements: ____________ Date: ____________ Start time: ____________ Stop time: ____________ Movements: ____________ Date: ____________ Start time: ____________ Stop time: ____________ Movements: ____________ This information is not intended to replace advice given to you by your health care provider. Make sure you discuss any questions you have with your health care provider. Document Released: 04/13/2006 Document Revised: 04/03/2018 Document Reviewed: 04/23/2015 Elsevier Patient Education  2020 Elsevier Inc. Braxton Hicks Contractions Contractions of the uterus can occur throughout pregnancy, but they are not always a sign that you are in labor. You may have practice contractions called Braxton Hicks contractions. These false labor contractions are sometimes confused with true labor. What are Braxton Hicks contractions? Braxton Hicks contractions are tightening movements that occur in the muscles of the uterus before labor. Unlike true labor contractions, these contractions do not result in opening (dilation) and thinning of the cervix. Toward the end of pregnancy (32-34 weeks), Braxton Hicks contractions can happen more often and may become stronger. These contractions are sometimes difficult to tell apart from true labor because they can be very uncomfortable. You should not feel embarrassed if you go to the hospital with false labor. Sometimes, the only way to tell if you are in true labor is for your health care provider to look for changes in the cervix. The health care provider will do a physical exam and may monitor your contractions. If you   are not in true labor, the exam should show  that your cervix is not dilating and your water has not broken. If there are no other health problems associated with your pregnancy, it is completely safe for you to be sent home with false labor. You may continue to have Braxton Hicks contractions until you go into true labor. How to tell the difference between true labor and false labor True labor  Contractions last 30-70 seconds.  Contractions become very regular.  Discomfort is usually felt in the top of the uterus, and it spreads to the lower abdomen and low back.  Contractions do not go away with walking.  Contractions usually become more intense and increase in frequency.  The cervix dilates and gets thinner. False labor  Contractions are usually shorter and not as strong as true labor contractions.  Contractions are usually irregular.  Contractions are often felt in the front of the lower abdomen and in the groin.  Contractions may go away when you walk around or change positions while lying down.  Contractions get weaker and are shorter-lasting as time goes on.  The cervix usually does not dilate or become thin. Follow these instructions at home:   Take over-the-counter and prescription medicines only as told by your health care provider.  Keep up with your usual exercises and follow other instructions from your health care provider.  Eat and drink lightly if you think you are going into labor.  If Braxton Hicks contractions are making you uncomfortable: ? Change your position from lying down or resting to walking, or change from walking to resting. ? Sit and rest in a tub of warm water. ? Drink enough fluid to keep your urine pale yellow. Dehydration may cause these contractions. ? Do slow and deep breathing several times an hour.  Keep all follow-up prenatal visits as told by your health care provider. This is important. Contact a health care provider if:  You have a fever.  You have continuous pain in  your abdomen. Get help right away if:  Your contractions become stronger, more regular, and closer together.  You have fluid leaking or gushing from your vagina.  You pass blood-tinged mucus (bloody show).  You have bleeding from your vagina.  You have low back pain that you never had before.  You feel your baby's head pushing down and causing pelvic pressure.  Your baby is not moving inside you as much as it used to. Summary  Contractions that occur before labor are called Braxton Hicks contractions, false labor, or practice contractions.  Braxton Hicks contractions are usually shorter, weaker, farther apart, and less regular than true labor contractions. True labor contractions usually become progressively stronger and regular, and they become more frequent.  Manage discomfort from Braxton Hicks contractions by changing position, resting in a warm bath, drinking plenty of water, or practicing deep breathing. This information is not intended to replace advice given to you by your health care provider. Make sure you discuss any questions you have with your health care provider. Document Released: 07/28/2016 Document Revised: 02/24/2017 Document Reviewed: 07/28/2016 Elsevier Patient Education  2020 Elsevier Inc.  

## 2018-11-13 NOTE — Progress Notes (Signed)
   PRENATAL VISIT NOTE  Subjective:  Amber Lambert is a 28 y.o. G2P1001 at [redacted]w[redacted]d being seen today for ongoing prenatal care.  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  Patient reports no complaints.  Contractions: Not present. Vag. Bleeding: None.  Movement: Present. Denies leaking of fluid.   The following portions of the patient's history were reviewed and updated as appropriate: allergies, current medications, past family history, past medical history, past social history, past surgical history and problem list.   Objective:   Vitals:   11/13/18 1115  BP: 114/71  Pulse: 73  Weight: 182 lb 6.4 oz (82.7 kg)    Fetal Status: Fetal Heart Rate (bpm): 145 Fundal Height: 35 cm Movement: Present     General:  Alert, oriented and cooperative. Patient is in no acute distress.  Skin: Skin is warm and dry. No rash noted.   Cardiovascular: Normal heart rate noted  Respiratory: Normal respiratory effort, no problems with respiration noted  Abdomen: Soft, gravid, appropriate for gestational age.  Pain/Pressure: Absent     Pelvic: Cervical exam deferred        Extremities: Normal range of motion.  Edema: Trace  Mental Status: Normal mood and affect. Normal behavior. Normal judgment and thought content.   Assessment and Plan:  Pregnancy: G2P1001 at [redacted]w[redacted]d 1. Supervision of other normal pregnancy, antepartum - Doing well - Discussed current admissions testing process for COVID and visitor restrictions  - All questions answered   Preterm labor symptoms and general obstetric precautions including but not limited to vaginal bleeding, contractions, leaking of fluid and fetal movement were  reviewed in detail with the patient. Please refer to After Visit Summary for other counseling recommendations.   Return in about 2 weeks (around 11/27/2018) for LOB, In-Person.  No future appointments.  Kerry Hough, PA-C

## 2018-11-13 NOTE — Progress Notes (Signed)
Pt presents for ROB. 

## 2018-11-27 ENCOUNTER — Ambulatory Visit (INDEPENDENT_AMBULATORY_CARE_PROVIDER_SITE_OTHER): Payer: Managed Care, Other (non HMO) | Admitting: Advanced Practice Midwife

## 2018-11-27 ENCOUNTER — Other Ambulatory Visit: Payer: Self-pay

## 2018-11-27 ENCOUNTER — Other Ambulatory Visit (HOSPITAL_COMMUNITY)
Admission: RE | Admit: 2018-11-27 | Discharge: 2018-11-27 | Disposition: A | Discharge status: Home / Self Care | Payer: Managed Care, Other (non HMO) | Source: Ambulatory Visit | Attending: Advanced Practice Midwife | Admitting: Advanced Practice Midwife

## 2018-11-27 VITALS — BP 118/77 | HR 65 | Wt 183.0 lb

## 2018-11-27 DIAGNOSIS — Z3480 Encounter for supervision of other normal pregnancy, unspecified trimester: Secondary | ICD-10-CM | POA: Insufficient documentation

## 2018-11-27 DIAGNOSIS — Z3A36 36 weeks gestation of pregnancy: Secondary | ICD-10-CM

## 2018-11-27 DIAGNOSIS — Z3483 Encounter for supervision of other normal pregnancy, third trimester: Secondary | ICD-10-CM

## 2018-11-27 NOTE — Progress Notes (Signed)
   PRENATAL VISIT NOTE  Subjective:  Amber Lambert is a 28 y.o. G2P1001 at [redacted]w[redacted]d being seen today for ongoing prenatal care.  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  Patient reports no complaints.  Contractions: Not present. Vag. Bleeding: None.  Movement: Present. Denies leaking of fluid.   The following portions of the patient's history were reviewed and updated as appropriate: allergies, current medications, past family history, past medical history, past social history, past surgical history and problem list.   Objective:   Vitals:   11/27/18 1411  BP: 118/77  Pulse: 65  Weight: 183 lb (83 kg)    Fetal Status: Fetal Heart Rate (bpm): 154 Fundal Height: 37 cm Movement: Present  Presentation: Vertex  General:  Alert, oriented and cooperative. Patient is in no acute distress.  Skin: Skin is warm and dry. No rash noted.   Cardiovascular: Normal heart rate noted  Respiratory: Normal respiratory effort, no problems with respiration noted  Abdomen: Soft, gravid, appropriate for gestational age.  Pain/Pressure: Present     Pelvic: Cervical exam performed Dilation: 1.5 Effacement (%): 60 Station: -2  Extremities: Normal range of motion.  Edema: Trace  Mental Status: Normal mood and affect. Normal behavior. Normal judgment and thought content.   Assessment and Plan:  Pregnancy: G2P1001 at [redacted]w[redacted]d  1. Supervision of other normal pregnancy, antepartum --Anticipatory guidance about next visits/weeks of pregnancy given. --Pt anxious about hearing FHR at each visit, desires in person visit next week.  Discussed normal fetal movement, fetal movement counting, reasons to seek care. --Pt  desires natural labor, alternative birth positions.  She desires female provider if possible. Discussed how this is likely possible but with emergencies, Ob/Gyn may be female and pt states understanding. - Cervicovaginal ancillary only( [place]) - Culture, beta strep (group b only)  Term labor symptoms and general obstetric precautions including but not limited to vaginal bleeding, contractions, leaking of fluid and fetal movement were reviewed in detail with the patient. Please refer to After Visit Summary for other counseling recommendations.   Return in about 1 week (around 12/04/2018).  No future appointments.  Fatima Blank, CNM

## 2018-11-27 NOTE — Progress Notes (Signed)
*  PCN Allergy

## 2018-11-27 NOTE — Patient Instructions (Signed)
Labor Precautions Reasons to come to [name] at [facility] and [place]:  1.  Contractions are  5 minutes apart or less, each last 1 minute, these have been going on for 1-2 hours, and you cannot walk or talk during them 2.  You have a large gush of fluid, or a trickle of fluid that will not stop and you have to wear a pad 3.  You have bleeding that is bright red, heavier than spotting--like menstrual bleeding (spotting can be normal in early labor or after a check of your cervix) 4.  You do not feel the baby moving like he/she normally does  Natural Childbirth Natural childbirth is when labor and delivery progresses naturally with minimal medical assistance or medicines. Natural childbirth may be an option if you have a low risk pregnancy. With the help of a birthing professional such as a midwife, doula, or other health care provider, you may be able to use relaxation techniques and controlled breathing to manage pain during labor. Many women choose natural childbirth because it makes them feel more in control and in touch with the experience of giving birth. Some women also choose natural childbirth because they are concerned that medicines may affect them and their babies. What types of natural childbirth techniques are available? There are two types of natural childbirth techniques, which are taught in classes:  The Lamaze method. In these classes, parents learn that having a baby is normal, healthy, and natural. Mothers are taught to take a neutral position regarding pain medicine and numbing medicines, and to make an informed decision about using these medicines if the time comes.  The [employer], also called husband-coached birth. In these classes, the father or partner learns to be the birth coach. The mother is encouraged to exercise and eat a balanced, nutritious diet. Both parents also learn relaxation and deep breathing exercises and are taught how to prepare for  emergency situations. What are some natural pain and relaxation techniques? If you are considering a natural childbirth, you should explore your options for managing pain and discomfort during your labor and delivery. Some natural pain and relaxation techniques include:  Meditation.  Yoga.  Hypnosis.  Acupuncture.  Massage.  Changing positions, such as by walking, rocking, showering, or leaning on birth balls.  Lying in warm water or a whirlpool bath.  Finding an activity that keeps your mind off the labor pain.  Listening to soft music.  Focusing on a particular object (visual imagery). How can I prepare for a natural birth?  Talk with your spouse or partner about your goals for having a natural childbirth.  Decide if you will have your baby in the hospital, at a birthing center, or at home.  Have your spouse or partner attend the natural childbirth technique classes with you.  Decide which type of health care provider you would like to assist you with your delivery.  If you have other children, make plans to have someone take care of them when you go to the hospital or birthing center.  Know the distance and the time it takes to go to the hospital or birthing center. Practice going there and time it to be sure.  Have a bag packed with a nightgown, bathrobe, and toiletries. Be ready to take it with you when you go into labor.  Keep phone numbers of your family and friends handy if you need to call someone when you go into labor.  Talk with your health  care provider about the possibility of a medical emergency and what will happen if that occurs. What are the advantages and disadvantages of natural childbirth? Advantages  You are in control of your labor and delivery experience.  You may be able to avoid some common medical practices, such as getting medicines or being monitored all the time.  You and your spouse or partner will work together, which can increase your  bond with each other.  In most delivery centers, your family and friends can be involved in the labor and delivery process. Disadvantages  The methods of helping relieve labor pains may not work for you.  You may feel disappointed if you change your mind during labor and decide not to have a natural childbirth. What can I expect after delivery?  You may feel very tired.  You may feel uncomfortable as your uterus contracts.  The area around your vagina will feel sore.  You may feel cold and shaky. What questions should I ask my health care provider?  Am I a good candidate for natural childbirth?  Can you refer me to classes to learn more about natural childbirth?  How do I create a birth plan that outlines my wishes for natural childbirth? Where to find more information  American Pregnancy Association: americanpregnancy.org  [employer] of Obstetricians and Gynecologists: acog.org  [employer]: www.midwife.org Summary  Natural childbirth may be an option if you have a low risk pregnancy.  The Elig[employer]ley or Lamaze Methods are techniques that can assist you in achieving a natural birth experience.  Talk to your health care provider to determine if you are a good candidate for a natural childbirth. This information is not intended to replace advice given to you by your health care provider. Make sure you discuss any questions you have with your health care provider. Document Released: 02/25/2008 Document Revised: 07/06/2018 Document Reviewed: 05/23/2016 Elsevier Patient Education  2020 [employer]

## 2018-11-29 LAB — CERVICOVAGINAL ANCILLARY ONLY
Bacterial vaginitis: NEGATIVE
Candida vaginitis: NEGATIVE
Chlamydia: NEGATIVE
Neisseria Gonorrhea: NEGATIVE
Trichomonas: NEGATIVE

## 2018-12-01 LAB — STREP GP B CULTURE+RFLX: Strep Gp B Culture+Rflx: NEGATIVE

## 2018-12-05 ENCOUNTER — Other Ambulatory Visit: Payer: Self-pay

## 2018-12-05 ENCOUNTER — Ambulatory Visit (INDEPENDENT_AMBULATORY_CARE_PROVIDER_SITE_OTHER): Payer: Managed Care, Other (non HMO) | Admitting: Family Medicine

## 2018-12-05 DIAGNOSIS — Z3483 Encounter for supervision of other normal pregnancy, third trimester: Secondary | ICD-10-CM

## 2018-12-05 DIAGNOSIS — Z3480 Encounter for supervision of other normal pregnancy, unspecified trimester: Secondary | ICD-10-CM

## 2018-12-05 DIAGNOSIS — Z3A37 37 weeks gestation of pregnancy: Secondary | ICD-10-CM

## 2018-12-05 NOTE — Progress Notes (Signed)
   PRENATAL VISIT NOTE  Subjective:  Amber Lambert is a 28 y.o. G2P1001 at [redacted]w[redacted]d being seen today for ongoing prenatal care.  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  Patient reports some irregular ctx but otherwise denies any complaints. Feeling baby move as normal.  Contractions: Irregular. Vag. Bleeding: None.  Movement: Present. Denies leaking of fluid.   The following portions of the patient's history were reviewed and updated as appropriate: allergies, current medications, past family history, past medical history, past social history, past surgical history and problem list.   Objective:   Vitals:   12/05/18 0933  BP: 120/78  Pulse: 71  Weight: 183 lb (83 kg)    Fetal Status: Fetal Heart Rate (bpm): 143 Fundal Height: 38 cm Movement: Present  Presentation: Vertex  General:  Alert, oriented and cooperative. Patient is in no acute distress.  Skin: Skin is warm and dry. No rash noted.   Cardiovascular: Normal heart rate noted  Respiratory: Normal respiratory effort, no problems with respiration noted  Abdomen: Soft, gravid, appropriate for gestational age.  Pain/Pressure: Present     Pelvic: Cervical exam performed Dilation: 1.5 Effacement (%): 60 Station: -2   Extremities: Normal range of motion.  Edema: Trace  Mental Status: Normal mood and affect. Normal behavior. Normal judgment and thought content.   Assessment and Plan:  Pregnancy: G2P1001 at [redacted]w[redacted]d 1. Supervision of other normal pregnancy, antepartum - GBS negative - Desires POPs - RTC in 1 week  Term labor symptoms and general obstetric precautions including but not limited to vaginal bleeding, contractions,  leaking of fluid and fetal movement were reviewed in detail with the patient. Please refer to After Visit Summary for other counseling recommendations.   Return in about 1 week (around 12/12/2018) for In-person, [place].  No future appointments.  Chauncey Mann, MD

## 2018-12-05 NOTE — Progress Notes (Signed)
Patient reports fetal movement with irregular contractions. 

## 2018-12-12 ENCOUNTER — Encounter (HOSPITAL_COMMUNITY): Payer: Self-pay | Admitting: *Deleted

## 2018-12-12 ENCOUNTER — Other Ambulatory Visit: Payer: Self-pay

## 2018-12-12 ENCOUNTER — Inpatient Hospital Stay (HOSPITAL_COMMUNITY)
Admission: AD | Admit: 2018-12-12 | Discharge: 2018-12-14 | DRG: 806 | Disposition: A | Discharge status: Home / Self Care | Payer: Managed Care, Other (non HMO) | Attending: Family Medicine | Admitting: Family Medicine

## 2018-12-12 DIAGNOSIS — Z3A38 38 weeks gestation of pregnancy: Secondary | ICD-10-CM

## 2018-12-12 DIAGNOSIS — Z20828 Contact with and (suspected) exposure to other viral communicable diseases: Secondary | ICD-10-CM | POA: Diagnosis present

## 2018-12-12 DIAGNOSIS — D563 Thalassemia minor: Secondary | ICD-10-CM | POA: Diagnosis present

## 2018-12-12 DIAGNOSIS — O479 False labor, unspecified: Secondary | ICD-10-CM | POA: Diagnosis present

## 2018-12-12 DIAGNOSIS — O26893 Other specified pregnancy related conditions, third trimester: Secondary | ICD-10-CM | POA: Diagnosis present

## 2018-12-12 DIAGNOSIS — O9832 Other infections with a predominantly sexual mode of transmission complicating childbirth: Secondary | ICD-10-CM | POA: Diagnosis present

## 2018-12-12 DIAGNOSIS — A630 Anogenital (venereal) warts: Secondary | ICD-10-CM | POA: Diagnosis present

## 2018-12-12 LAB — CBC
HCT: 32.2 % — ABNORMAL LOW (ref 36.0–46.0)
Hemoglobin: 10.8 g/dL — ABNORMAL LOW (ref 12.0–15.0)
MCH: 29 pg (ref 26.0–34.0)
MCHC: 33.5 g/dL (ref 30.0–36.0)
MCV: 86.6 fL (ref 80.0–100.0)
Platelets: 153 10*3/uL (ref 150–400)
RBC: 3.72 MIL/uL — ABNORMAL LOW (ref 3.87–5.11)
RDW: 13.5 % (ref 11.5–15.5)
WBC: 14.8 10*3/uL — ABNORMAL HIGH (ref 4.0–10.5)
nRBC: 0 % (ref 0.0–0.2)

## 2018-12-12 LAB — RPR: RPR Ser Ql: NONREACTIVE

## 2018-12-12 LAB — TYPE AND SCREEN
ABO/RH(D): O POS
Antibody Screen: NEGATIVE

## 2018-12-12 LAB — ABO/RH: ABO/RH(D): O POS

## 2018-12-12 LAB — SARS CORONAVIRUS 2 BY RT PCR (HOSPITAL ORDER, PERFORMED IN ~~LOC~~ HOSPITAL LAB): SARS Coronavirus 2: NEGATIVE

## 2018-12-12 MED ORDER — ONDANSETRON HCL 4 MG/2ML IJ SOLN: 4.0000 mg | INTRAMUSCULAR | Status: DC | PRN

## 2018-12-12 MED ORDER — BENZOCAINE-MENTHOL 20-0.5 % EX AERO
1.0000 "application " | INHALATION_SPRAY | CUTANEOUS | Status: DC | PRN
  Filled 2018-12-12: qty 56

## 2018-12-12 MED ORDER — WITCH HAZEL-GLYCERIN EX PADS: 1.0000 "application " | MEDICATED_PAD | CUTANEOUS | Status: DC | PRN

## 2018-12-12 MED ORDER — COCONUT OIL OIL
1.0000 "application " | TOPICAL_OIL | Status: DC | PRN
  Administered 2018-12-13: 1 via TOPICAL

## 2018-12-12 MED ORDER — SIMETHICONE 80 MG PO CHEW: 80.0000 mg | CHEWABLE_TABLET | ORAL | Status: DC | PRN

## 2018-12-12 MED ORDER — PRENATAL MULTIVITAMIN CH
1.0000 | ORAL_TABLET | Freq: Every day | ORAL | Status: DC
  Administered 2018-12-12 – 2018-12-14 (×3): 1 via ORAL
  Filled 2018-12-12 (×3): qty 1

## 2018-12-12 MED ORDER — IBUPROFEN 600 MG PO TABS
600.0000 mg | ORAL_TABLET | Freq: Once | ORAL | Status: AC
  Administered 2018-12-12: 600 mg via ORAL
  Filled 2018-12-12: qty 1

## 2018-12-12 MED ORDER — ONDANSETRON HCL 4 MG PO TABS: 4.0000 mg | ORAL_TABLET | ORAL | Status: DC | PRN

## 2018-12-12 MED ORDER — ZOLPIDEM TARTRATE 5 MG PO TABS: 5.0000 mg | ORAL_TABLET | Freq: Every evening | ORAL | Status: DC | PRN

## 2018-12-12 MED ORDER — IBUPROFEN 600 MG PO TABS
600.0000 mg | ORAL_TABLET | Freq: Four times a day (QID) | ORAL | Status: DC
  Administered 2018-12-12 – 2018-12-14 (×8): 600 mg via ORAL
  Filled 2018-12-12 (×9): qty 1

## 2018-12-12 MED ORDER — DIBUCAINE (PERIANAL) 1 % EX OINT: 1.0000 "application " | TOPICAL_OINTMENT | CUTANEOUS | Status: DC | PRN

## 2018-12-12 MED ORDER — ACETAMINOPHEN 325 MG PO TABS
650.0000 mg | ORAL_TABLET | ORAL | Status: DC | PRN
  Administered 2018-12-12 – 2018-12-14 (×2): 650 mg via ORAL
  Filled 2018-12-12 (×2): qty 2

## 2018-12-12 MED ORDER — TETANUS-DIPHTH-ACELL PERTUSSIS 5-2.5-18.5 LF-MCG/0.5 IM SUSP: 0.5000 mL | Freq: Once | INTRAMUSCULAR | Status: DC

## 2018-12-12 MED ORDER — DIPHENHYDRAMINE HCL 25 MG PO CAPS: 25.0000 mg | ORAL_CAPSULE | Freq: Four times a day (QID) | ORAL | Status: DC | PRN

## 2018-12-12 MED ORDER — SENNOSIDES-DOCUSATE SODIUM 8.6-50 MG PO TABS
2.0000 | ORAL_TABLET | ORAL | Status: DC
  Administered 2018-12-12 – 2018-12-13 (×2): 2 via ORAL
  Filled 2018-12-12 (×2): qty 2

## 2018-12-12 MED ORDER — ACETAMINOPHEN 325 MG PO TABS: 650.0000 mg | ORAL_TABLET | ORAL | Status: DC | PRN

## 2018-12-12 NOTE — H&P (Signed)
Amber KnucklesChristian Renae GlossShelton is a 28 y.o. female presenting for labor Gets care at [facility] [place]  . OB History    Gravida  2   Para  1   Term  1   Preterm      AB      Living  1     SAB      TAB      Ectopic      Multiple      Live Births  1          Past Medical History:  Diagnosis Date  . Anemia   . Trichomonas contact, treated    Past Surgical History:  Procedure Laterality Date  . addenoidectomy    . TONSILLECTOMY    . WISDOM TOOTH EXTRACTION     Family History: family history includes Diabetes in her paternal grandfather; Hypertension in her father and paternal grandfather. Social History:  reports that she has never smoked. She has never used smokeless tobacco. She reports that she does not drink alcohol or use drugs.     Maternal Diabetes: No Genetic Screening: Normal Maternal Ultrasounds/Referrals: Normal Fetal Ultrasounds or other Referrals:  None Maternal Substance Abuse:  No Significant Maternal Medications:  None Significant Maternal Lab Results:  None Other Comments:  None  Review of Systems  Unable to perform ROS: Acuity of condition  Constitutional: Negative for chills and fever.       Patient in active labor, fetal head crowning   Respiratory: Negative for shortness of breath.   Cardiovascular: Negative for leg swelling.  Gastrointestinal: Positive for abdominal pain. Negative for constipation, diarrhea, nausea and vomiting.   Maternal Medical History:  Reason for admission: Contractions.  Nausea.  Contractions: Onset was 1-2 hours ago.   Frequency: regular.   Perceived severity is strong.    Fetal activity: Perceived fetal activity is normal.   Last perceived fetal movement was within the past hour.    Prenatal complications: No bleeding, PIH, placental abnormality, pre-eclampsia or preterm labor.   Prenatal Complications - Diabetes: none.      Blood pressure 127/80, pulse 73, last menstrual period  03/16/2018, unknown if currently breastfeeding. Maternal Exam:  Uterine Assessment: Contraction strength is firm.  Contraction frequency is regular.   Abdomen: Patient reports no abdominal tenderness. Fetal presentation: vertex  Introitus: Normal vulva. Normal vagina.  Ferning test: not done.  Nitrazine test: not done.  Cervix: Cervix evaluated by digital exam.     Fetal Exam Fetal Monitor Review: Mode: ultrasound.   Baseline rate: 135.  Variability: moderate (6-25 bpm).   Pattern: accelerations present and no decelerations.    Fetal State Assessment: Category I - tracings are normal.     Physical Exam  Constitutional: She is oriented to person, place, and time. She appears well-developed and well-nourished. No distress.  HENT:  Head: Normocephalic.  Cardiovascular: Normal rate and regular rhythm.  Respiratory: Effort normal. No respiratory distress.  GI: Soft. She exhibits no distension and no mass. There is no abdominal tenderness. There is no rebound and no guarding.  Genitourinary:    Vulva and vagina normal.   Musculoskeletal: Normal range of motion.  Neurological: She is alert and oriented to person, place, and time.  Skin: Skin is warm and dry.  Psychiatric: She has a normal mood and affect.    Prenatal labs: ABO, Rh: O/Positive/-- (03/03 1400) Antibody: Negative (03/03 1400) Rubella: 3.76 (03/03 1400) RPR: Non Reactive (07/06 1002)  HBsAg: Negative (03/03 1400)  HIV:  Non Reactive (07/06 1002)  GBS: Negative/-- (09/01 0323)   Assessment/Plan: Single intrauterine pregnancy at [redacted]w[redacted]d Precipitous labor  Admit to [place] 12/12/2018, 5:45 AM

## 2018-12-12 NOTE — MAU Note (Signed)
Pt arrived to [name] front desk called after registering patient stating she was uncomfortable. Pt was brought back to a room. Pt was assisted to the bed where I Charvi Gammage RN noticed the baby was crowning. Provider Hansel Feinstein CNM called to the bedside. At 0402 a viable female infant was born and placed skin to skin with the mother.

## 2018-12-12 NOTE — Lactation Note (Signed)
This note was copied from a baby's chart. Lactation Consultation Note  Patient Name: Amber Lambert MCNOB'S Date: 12/12/2018 Reason for consult: Initial assessment;Other (Comment);Early term 37-38.6wks(DAT (+))  19 hours old ETI female who is being exclusively BF by her mother, she's a P2 and experienced BF. She was able to BF her first child for 6 months and the only BF difficulty reported was when she had to go back to work (infant separation), her supply dwindled. Mom participated in the [facility] program at [facility] and she's familiar with hand expression.  When LC revised hand expression with mom she was able to get big drops of colostrum out of both breasts, she also did teach back and rubbed them in baby's mouth, praised her for her efforts. She has a DEBP at home.  Offered assistance with latch and mom agreed to wake baby up to feed, she was in her bassinet. LC took baby STS to mother's left breast in cross cradle position but she wasn't able to latch, baby was very sleepy, she was also spitty, had a small emesis that looked like whitish mucus. Mom kept baby STS, asked her to call for assistance when needed.  Reviewed normal newborn behavior, feeding cues, newborn jaundice and cluster feeding. Mom is also aware of baby's DAT (+) status. Offered to set up a DEBP, but mom not ready to pump yet, she'll let her RN know when she's ready.   Feeding plan:  1. Encouraged mom to feed baby STS 8-12 times/24 hours or sooner if feeding cues are present 2. Hand expression and spoon feeding were also strongly encouraged  BF brochure, BF resources and feeding diary were reviewed. Mom reported all questions and concerns were answered, she's aware of [place] OP services and will call PRN.  Maternal Data Formula Feeding for Exclusion: No Has patient been taught Hand Expression?: Yes Does the patient have breastfeeding experience prior to this delivery?: Yes  Feeding Feeding Type: Breast  Fed  LATCH Score                   Interventions Interventions: Breast feeding basics reviewed;Assisted with latch;Skin to skin;Breast massage;Hand express;Breast compression;Adjust position;Support pillows  Lactation Tools Discussed/Used WIC Program: Yes   Consult Status Consult Status: Follow-up Date: 12/13/18 Follow-up type: In-patient    Shada Nienaber Francene Boyers 12/12/2018, 4:38 PM

## 2018-12-12 NOTE — Discharge Summary (Signed)
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 yo G2P1001 at 375w5das admitted in Active Labor on 12/12/2018. Patient had an uncomplicated labor course as follows:  [name] crow[place] Delivered easily via SVD  Membrane Rupture Time/Date: 4:00 AM ,12/12/2018   Intrapartum Procedures: Episiotomy:                                          Lacerations:  Labial [10]  Patient had a delivery of a Viable infant. 12/12/2018  Information for the patient's newborn:  ShJessamyn, Watterson0[[id number]]Delivery Method: Vaginal, Spontaneous(Filed from Delivery Summary)     Pateint had an uncomplicated postpartum course.  She is ambulating, tolerating a regular diet, passing flatus, and urinating well. Patient is discharged home in stable condition on 12/14/18.  Delivery time: 4:02 AM    Magnesium Sulfate received: No BMZ received: No Rhophylac:N/A MMR:N/A Transfusion:No  Physical exam  Vitals:   12/13/18 1455 12/13/18 1940 12/13/18 2113 12/14/18 0519  BP: 99/71 107/71 111/78 110/66  Pulse: 79 72 81 67  Resp: _0 Temp: 98.7 F (37.1 C) 98.9 F (37.2 C) 98.4 F (36.9 C) 98.5 F (36.9 C)  TempSrc: Oral Oral Oral Oral  SpO2: 100% 100% 100% 100%   General: alert, cooperative and no  distress Lochia: appropriate Uterine Fundus: firm Incision: N/A DVT Evaluation: No evidence of DVT seen on physical exam. Negative Homan's sign. No cords or calf tenderness. No significant calf/ankle edema. Labs: Lab Results  Component Value Date   WBC 14.8 (H) 12/12/2018   HGB 10.8 (L) 12/12/2018   HCT 32.2 (L) 12/12/2018   MCV 86.6 12/12/2018   PLT 153 12/12/2018   No flowsheet data found.  Discharge instruction: per After Visit Summary and "Baby and Me Booklet".  After visit meds:  Allergies as of 12/14/2018      Reactions   Penicillins Rash   As a child      Medication List    TAKE these medications   acetaminophen 325 MG tablet Commonly known as: Tylenol Take 2 tablets (650 mg total) by mouth every 4 (four) hours as needed (for pain scale < 4).   butalbital-acetaminophen-caffeine 50-325-40 MG tablet Commonly known as: FIORICET Take 2 tablets by mouth every 6 (six)  hours as needed for headache.   Concept OB 130-92.4-1 MG Caps Take 1 capsule by mouth daily.   ferrous sulfate 325 (65 FE) MG tablet Take 325 mg by mouth daily with breakfast.   ibuprofen 600 MG tablet Commonly known as: ADVIL Take 1 tablet (600 mg total) by mouth every 6 (six) hours.   Misc. Devices Misc Dispense one maternity belt for patient   norethindrone 0.35 MG tablet Commonly known as: Ortho Micronor Take 1 tablet (0.35 mg total) by mouth daily.   polyethylene glycol powder 17 GM/SCOOP powder Commonly known as: GLYCOLAX/MIRALAX Take 255 g by mouth once for 1 dose.   prenatal multivitamin Tabs tablet Take 1 tablet by mouth daily at 12 noon.       Diet: routine diet  Activity: Advance as tolerated. Pelvic rest for 6 weeks.   Outpatient follow up:4 weeks Follow up Appt: Future Appointments  Date Time Provider [place]  01/09/2019  1:15 PM Surah Pelley L, DO [facility] None   Follow up Visit:   Please schedule this patient for Postpartum visit in: 4 weeks with  the following provider: Any provider For C/S patients schedule nurse incision check in weeks 2 weeks: no Low risk pregnancy complicated by: none Delivery mode:  SVD Anticipated Birth Control:  POPs PP Procedures needed: none  Schedule Integrated BH visit: no      Newborn Data: Live born female  Birth Weight:  3399g APGAR: 54, 9  Newborn Delivery   Birth date/time: 12/12/2018 04:02:00 Delivery type: Vaginal, Spontaneous      Baby Feeding: Breast Disposition:home with mother   Merilyn Baba, DO OB Fellow, Faculty Practice 12/14/2018 1:35 PM

## 2018-12-13 ENCOUNTER — Encounter: Payer: Managed Care, Other (non HMO) | Admitting: Certified Nurse Midwife

## 2018-12-13 NOTE — Progress Notes (Signed)
Post Partum Day #1 Subjective: no complaints, up ad lib and tolerating PO; breastfeeding going well; plans on POPs for contraception; baby under phototherapy  Objective: Blood pressure 111/84, pulse 77, temperature 98.8 F (37.1 C), temperature source Oral, resp. rate 14, last menstrual period 03/16/2018, SpO2 100 %, unknown if currently breastfeeding.  Physical Exam:  General: alert, cooperative and no distress Lochia: appropriate Uterine Fundus: firm DVT Evaluation: No evidence of DVT seen on physical exam.  Recent Labs    12/12/18 0535  HGB 10.8*  HCT 32.2*    Assessment/Plan: Plan for discharge tomorrow   LOS: 1 day   Myrtis Ser CNM 12/13/2018, 7:55 AM

## 2018-12-14 MED ORDER — POLYETHYLENE GLYCOL 3350 17 GM/SCOOP PO POWD: 1.0000 | Freq: Once | ORAL | 0 refills | Status: AC

## 2018-12-14 MED ORDER — IBUPROFEN 600 MG PO TABS: 600.0000 mg | ORAL_TABLET | Freq: Four times a day (QID) | ORAL | 0 refills | Status: AC

## 2018-12-14 MED ORDER — ACETAMINOPHEN 325 MG PO TABS: 650.0000 mg | ORAL_TABLET | ORAL | 0 refills | Status: AC | PRN

## 2018-12-14 MED ORDER — NORETHINDRONE 0.35 MG PO TABS: 1.0000 | ORAL_TABLET | Freq: Every day | ORAL | 11 refills | Status: AC

## 2018-12-14 NOTE — Lactation Note (Signed)
This note was copied from a baby's chart. Lactation Consultation Note  Patient Name: Amber Lambert Date: 12/14/2018 Reason for consult: Follow-up assessment  P2 mother whose infant is now 37 hours old.    Mother had a couple of breastfeeding questions which I answered to her satisfaction.    Mother states that breast feeding is getting better.  She feels baby is latching well and has uterine contractions during feedings.  Her breasts are soft and non tender and nipples are intact.  She does have some soreness and I encouraged the use of EBM followed by coconut oil which she has at her bedside.  Mother will continue to feed 8-12 times/24 hours or sooner if baby shows cues.  Encouraged hand expression before/after feedings to help increase supply.  Engorgement prevention/treatment reviewed.  Manual pump given with instructions for use.    Mother will return to work in 2 weeks and has a DEBP for home use.  She has our OP phone number to use as needed.   Maternal Data    Feeding Feeding Type: Breast Fed  LATCH Score Latch: Grasps breast easily, tongue down, lips flanged, rhythmical sucking.  Audible Swallowing: Spontaneous and intermittent  Type of Nipple: Everted at rest and after stimulation  Comfort (Breast/Nipple): Soft / non-tender  Hold (Positioning): No assistance needed to correctly position infant at breast.  LATCH Score: 10  Interventions    Lactation Tools Discussed/Used     Consult Status Consult Status: Complete Date: 12/14/18 Follow-up type: Call as needed    Burt Piatek R Joshaua Epple 12/14/2018, 8:58 AM

## 2018-12-14 NOTE — Discharge Instructions (Signed)
It is important that you either obtain records from your colposcopy or instead have a repeat colposcopy to ensure that the abnormal cells on your cervix have gone away. Please make sure to discuss this with your provider at your first postpartum visit.    Postpartum Care After Vaginal Delivery This sheet gives you information about how to care for yourself from the time you deliver your baby to up to 6-12 weeks after delivery (postpartum period). Your health care provider may also give you more specific instructions. If you have problems or questions, contact your health care provider. Follow these instructions at home: Vaginal bleeding  It is normal to have vaginal bleeding (lochia) after delivery. Wear a sanitary pad for vaginal bleeding and discharge. ? During the first week after delivery, the amount and appearance of lochia is often similar to a menstrual period. ? Over the next few weeks, it will gradually decrease to a dry, yellow-brown discharge. ? For most women, lochia stops completely by 4-6 weeks after delivery. Vaginal bleeding can vary from woman to woman.  Change your sanitary pads frequently. Watch for any changes in your flow, such as: ? A sudden increase in volume. ? A change in color. ? Large blood clots.  If you pass a blood clot from your vagina, save it and call your health care provider to discuss. Do not flush blood clots down the toilet before talking with your health care provider.  Do not use tampons or douches until your health care provider says this is safe.  If you are not breastfeeding, your period should return 6-8 weeks after delivery. If you are feeding your child breast milk only (exclusive breastfeeding), your period may not return until you stop breastfeeding. Perineal care  Keep the area between the vagina and the anus (perineum) clean and dry as told by your health care provider. Use medicated pads and pain-relieving sprays and creams as  directed.  If you had a cut in the perineum (episiotomy) or a tear in the vagina, check the area for signs of infection until you are healed. Check for: ? More redness, swelling, or pain. ? Fluid or blood coming from the cut or tear. ? Warmth. ? Pus or a bad smell.  You may be given a squirt bottle to use instead of wiping to clean the perineum area after you go to the bathroom. As you start healing, you may use the squirt bottle before wiping yourself. Make sure to wipe gently.  To relieve pain caused by an episiotomy, a tear in the vagina, or swollen veins in the anus (hemorrhoids), try taking a warm sitz bath 2-3 times a day. A sitz bath is a warm water bath that is taken while you are sitting down. The water should only come up to your hips and should cover your buttocks. Breast care  Within the first few days after delivery, your breasts may feel heavy, full, and uncomfortable (breast engorgement). Milk may also leak from your breasts. Your health care provider can suggest ways to help relieve the discomfort. Breast engorgement should go away within a few days.  If you are breastfeeding: ? Wear a bra that supports your breasts and fits you well. ? Keep your nipples clean and dry. Apply creams and ointments as told by your health care provider. ? You may need to use breast pads to absorb milk that leaks from your breasts. ? You may have uterine contractions every time you breastfeed for up to several weeks  after delivery. Uterine contractions help your uterus return to its normal size. ? If you have any problems with breastfeeding, work with your health care provider or Advertising copywriter.  If you are not breastfeeding: ? Avoid touching your breasts a lot. Doing this can make your breasts produce more milk. ? Wear a good-fitting bra and use cold packs to help with swelling. ? Do not squeeze out (express) milk. This causes you to make more milk. Intimacy and sexuality  Ask your  health care provider when you can engage in sexual activity. This may depend on: ? Your risk of infection. ? How fast you are healing. ? Your comfort and desire to engage in sexual activity.  You are able to get pregnant after delivery, even if you have not had your period. If desired, talk with your health care provider about methods of birth control (contraception). Medicines  Take over-the-counter and prescription medicines only as told by your health care provider.  If you were prescribed an antibiotic medicine, take it as told by your health care provider. Do not stop taking the antibiotic even if you start to feel better. Activity  Gradually return to your normal activities as told by your health care provider. Ask your health care provider what activities are safe for you.  Rest as much as possible. Try to rest or take a nap while your baby is sleeping. Eating and drinking   Drink enough fluid to keep your urine pale yellow.  Eat high-fiber foods every day. These may help prevent or relieve constipation. High-fiber foods include: ? Whole grain cereals and breads. ? Brown rice. ? Beans. ? Fresh fruits and vegetables.  Do not try to lose weight quickly by cutting back on calories.  Take your prenatal vitamins until your postpartum checkup or until your health care provider tells you it is okay to stop. Lifestyle  Do not use any products that contain nicotine or tobacco, such as cigarettes and e-cigarettes. If you need help quitting, ask your health care provider.  Do not drink alcohol, especially if you are breastfeeding. General instructions  Keep all follow-up visits for you and your baby as told by your health care provider. Most women visit their health care provider for a postpartum checkup within the first 3-6 weeks after delivery. Contact a health care provider if:  You feel unable to cope with the changes that your child brings to your life, and these feelings do  not go away.  You feel unusually sad or worried.  Your breasts become red, painful, or hard.  You have a fever.  You have trouble holding urine or keeping urine from leaking.  You have little or no interest in activities you used to enjoy.  You have not breastfed at all and you have not had a menstrual period for 12 weeks after delivery.  You have stopped breastfeeding and you have not had a menstrual period for 12 weeks after you stopped breastfeeding.  You have questions about caring for yourself or your baby.  You pass a blood clot from your vagina. Get help right away if:  You have chest pain.  You have difficulty breathing.  You have sudden, severe leg pain.  You have severe pain or cramping in your lower abdomen.  You bleed from your vagina so much that you fill more than one sanitary pad in one hour. Bleeding should not be heavier than your heaviest period.  You develop a severe headache.  You faint.  You have blurred vision or spots in your vision.  You have bad-smelling vaginal discharge.  You have thoughts about hurting yourself or your baby. If you ever feel like you may hurt yourself or others, or have thoughts about taking your own life, get help right away. You can go to the nearest emergency department or call:  Your local emergency services (911 in the [place]  A suicide crisis helpline, such as the National Suicide Prevention Lifeline at 564 250 47431-272-261-0746. This is open 24 hours a day. Summary  The period of time right after you deliver your newborn up to 6-12 weeks after delivery is called the postpartum period.  Gradually return to your normal activities as told by your health care provider.  Keep all follow-up visits for you and your baby as told by your health care provider. This information is not intended to replace advice given to you by your health care provider. Make sure you discuss any questions you have with your health care  provider. Document Released: 01/09/2007 Document Revised: 03/17/2017 Document Reviewed: 12/26/2016 Elsevier Patient Education  2020 [employer]

## 2018-12-19 ENCOUNTER — Other Ambulatory Visit: Payer: Self-pay

## 2018-12-19 MED ORDER — POLYETHYLENE GLYCOL 3350 17 G PO PACK: 17.0000 g | PACK | Freq: Every day | ORAL | 0 refills | Status: AC

## 2018-12-19 NOTE — Progress Notes (Signed)
Single dose packets miralax sent to pts pharmacy

## 2018-12-20 ENCOUNTER — Encounter: Payer: Managed Care, Other (non HMO) | Admitting: Internal Medicine

## 2018-12-26 ENCOUNTER — Telehealth: Payer: Self-pay

## 2018-12-26 DIAGNOSIS — Z3490 Encounter for supervision of normal pregnancy, unspecified, unspecified trimester: Secondary | ICD-10-CM

## 2018-12-26 NOTE — Telephone Encounter (Signed)
  Notified pt that FMLA paperwork is completed, she needs to pay.  Pt transferred to [employer] to make payment. Paperwork faxed per patient request.

## 2019-01-09 ENCOUNTER — Other Ambulatory Visit (HOSPITAL_COMMUNITY)
Admission: RE | Admit: 2019-01-09 | Discharge: 2019-01-09 | Disposition: A | Discharge status: Home / Self Care | Payer: Managed Care, Other (non HMO) | Source: Ambulatory Visit | Attending: Family Medicine | Admitting: Family Medicine

## 2019-01-09 ENCOUNTER — Encounter: Payer: Self-pay | Admitting: Family Medicine

## 2019-01-09 ENCOUNTER — Ambulatory Visit (INDEPENDENT_AMBULATORY_CARE_PROVIDER_SITE_OTHER): Payer: Medicaid Other | Admitting: Family Medicine

## 2019-01-09 ENCOUNTER — Other Ambulatory Visit: Payer: Self-pay

## 2019-01-09 DIAGNOSIS — Z1389 Encounter for screening for other disorder: Secondary | ICD-10-CM | POA: Diagnosis not present

## 2019-01-09 DIAGNOSIS — Z3480 Encounter for supervision of other normal pregnancy, unspecified trimester: Secondary | ICD-10-CM

## 2019-01-09 DIAGNOSIS — R87611 Atypical squamous cells cannot exclude high grade squamous intraepithelial lesion on cytologic smear of cervix (ASC-H): Secondary | ICD-10-CM

## 2019-01-09 DIAGNOSIS — R32 Unspecified urinary incontinence: Secondary | ICD-10-CM | POA: Insufficient documentation

## 2019-01-09 DIAGNOSIS — N39498 Other specified urinary incontinence: Secondary | ICD-10-CM

## 2019-01-09 NOTE — Patient Instructions (Signed)

## 2019-01-09 NOTE — Progress Notes (Signed)
Subjective:     Amber Lambert is a 28 y.o. female who presents for a postpartum visit. She is 4 weeks postpartum following a spontaneous vaginal delivery. I have fully reviewed the prenatal and intrapartum course. The delivery was at 38.5 gestational weeks. Outcome: spontaneous vaginal delivery. Anesthesia: none. Postpartum course has been Unremarkable. Baby's course has been Unremarkable. Baby is feeding by breast. Bleeding no bleeding. Bowel function is constipated. Bladder function is abnormal: patient complains of not being able to hold her urine. Patient is not sexually active. Contraception method is OCP (estrogen/progesterone). Postpartum depression screening: negative=3.  Patient finds that she is unable to hold her urine for long periods of time. She reports that if she has to wait, and tries to hold it, that despite clenching her muscles, she is unable to do so. She will experience the urge to go, and have subsequent incontinence. She denies frequency, urgency, dysuria, nocturnal incontinence, dribbling, weak stream, or incontinence while laughing/coughing/sneezing.  Patient's medical history updated.  Review of Systems Pertinent items are noted in HPI.   Objective:    BP 129/74   Pulse 85   Ht 5\' 4"  (1.626 m)   Wt 172 lb (78 kg)   LMP 03/16/2018 (Exact Date)   Breastfeeding Yes   BMI 29.52 kg/m   General:  alert, cooperative and appears stated age   Breasts:  inspection negative, no nipple discharge or bleeding, no masses or nodularity palpable  Lungs: clear to auscultation bilaterally  Heart:  regular rate and rhythm, S1, S2 normal, no murmur, click, rub or gallop  Abdomen: soft, non-tender; bowel sounds normal; no masses,  no organomegaly   Vulva:  normal  Vagina: normal vagina, no discharge, exudate, lesion, or erythema  Cervix:  anteverted  Corpus: normal  Adnexa:  normal adnexa  Rectal Exam: Not performed.        Assessment:     Normal postpartum exam. Pap smear  done at today's visit.   Plan:  1. Postpartum care and examination - Contraception: oral progesterone-only contraceptive - Breast feeding is going well  2. Atypical squamous cells cannot exclude high grade squamous intraepithelial lesion on cytologic smear of cervix (ASC-H) - No records from [place], repeat Pap today, follow up per results - Cytology - PAP( [place])  3. Other urinary incontinence - Stress v. Urgency - Hx two vaginal births - educated on Kegel exercises, will refer to pelvic floor PT - Ambulatory referral to Physical Therapy  Follow up: 1 month for urinary incontinence.  Merilyn Baba, DO OB Fellow, Faculty [facility] 01/09/2019 2:08 PM

## 2019-01-10 ENCOUNTER — Other Ambulatory Visit: Payer: Self-pay

## 2019-01-15 LAB — CYTOLOGY - PAP
Diagnosis: NEGATIVE
High risk HPV: NEGATIVE

## 2019-01-24 ENCOUNTER — Other Ambulatory Visit: Payer: Self-pay

## 2019-01-24 ENCOUNTER — Ambulatory Visit: Payer: Managed Care, Other (non HMO) | Attending: Family Medicine | Admitting: Physical Therapy

## 2019-01-24 DIAGNOSIS — R252 Cramp and spasm: Secondary | ICD-10-CM | POA: Diagnosis present

## 2019-01-24 DIAGNOSIS — M545 Low back pain, unspecified: Secondary | ICD-10-CM

## 2019-01-24 DIAGNOSIS — M6281 Muscle weakness (generalized): Secondary | ICD-10-CM | POA: Diagnosis not present

## 2019-01-24 NOTE — Patient Instructions (Signed)

## 2019-01-24 NOTE — Therapy (Signed)
[facility] [address] [place], [place], [address] Phone: [phone]   Fax:  [phone]  Physical Therapy Evaluation  Patient Details  Name: Amber Lambert MRN: [id number] Date of Birth: 1990/08/31 Referring Provider (PT): Marlowe Alt, DO   Encounter Date: 01/24/2019  PT End of Session - 01/24/19 1019    Visit Number  1    Date for PT Re-Evaluation  04/18/19    Authorization Type  MCA[employer]    Authorization - Visit Number  0    Authorization - Number of Visits  3    PT Start Time  0932    PT Stop Time  1008    PT Time Calculation (min)  36 min    Activity Tolerance  Patient tolerated treatment well    Behavior During Therapy  [facility] for tasks assessed/performed       Past Medical History:  Diagnosis Date  . Anemia   . Trichomonas contact, treated     Past Surgical History:  Procedure Laterality Date  . addenoidectomy    . TONSILLECTOMY    . WISDOM TOOTH EXTRACTION      There were no vitals filed for this visit.   Subjective Assessment - 01/24/19 0935    Subjective  I have to go to the bathroom more frequently because I can't hold it. I have pelvic pain when walking and low back pain after lying down or not moving for a long time. Pelvic pain gets up to 9/10 and I start limping when I do chores around the house.    Limitations  Walking;Sitting;House hold activities    How long can you walk comfortably?  3-4 hours of walking around the house pain is 9/10 and limps and hurts until the next day    Patient Stated Goals  Not have leakage    Currently in Pain?  No/denies         [facility] PT Assessment - 01/24/19 0001      Assessment   Medical Diagnosis  N39.498 (ICD-10-CM) - Other urinary incontinence    Referring Provider (PT)  Sparacino, Hailey L, DO    Prior Therapy  No      Precautions   Precautions  None      Restrictions   Weight Bearing Restrictions  No      Balance Screen   Has the  patient fallen in the past 6 months  Yes      Home Environment   Living Environment  Private residence    Living Arrangements  Spouse/significant other;Children   2 children     Prior Function   Level of Independence  Independent    Vocation  --   maternity leave     Cognition   Overall Cognitive Status  Within Functional Limits for tasks assessed      Observation/Other Assessments   Observations  anterior pelvic tilt Rt>Lt      Posture/Postural Control   Posture/Postural Control  Postural limitations    Postural Limitations  Increased lumbar lordosis;Anterior pelvic tilt;Right pelvic obliquity;Weight shift left   trunk shifting left in standing     ROM / Strength   AROM / PROM / Strength  PROM;Strength      PROM   Overall PROM Comments  Lt hip flexion and IR 50% limited and +pain in low back and into left hip      Strength   Overall Strength Comments  Rt hip abduction 4/5  Flexibility   Soft Tissue Assessment /Muscle Length  yes    Hamstrings  unclear of Lt hamstring due to pain with hip flexion past 80 deg      Palpation   Palpation comment  Lt SI joint TTP; 2 finger diastasis of rectus abdominus entire length of the muscle      Special Tests    Special Tests  Lumbar    Lumbar Tests  Straight Leg Raise      Straight Leg Raise   Findings  Negative    Side   Left    Comment  increased pain but dtermined to be from hip flexion      Ambulation/Gait   Gait Pattern  Within Functional Limits                Objective measurements completed on examination: See above findings.    Pelvic Floor Special Questions - 01/24/19 0001    Prior Pelvic/Prostate Exam  Yes    Are you Pregnant or attempting pregnancy?  No    Prior Pregnancies  Yes    Number of Pregnancies  2    Number of Vaginal Deliveries  2    Diastasis Recti  yes, 2 fingers    Currently Sexually Active  No    Marinoff Scale  --   no intercourse at this time   Urinary Leakage  Yes    How  often  has happened  2x - full bladder    Activities that cause leaking  With strong urge    Urinary frequency  more than usual and JIC peeing    Fecal incontinence  No   constipation   Falling out feeling (prolapse)  No    Skin Integrity  Intact    Perineal Body/Introitus   Descended    Prolapse  Anterior Wall   mild with pushing   Pelvic Floor Internal Exam  pt identity confirmed and informed consent given to perform internal soft tissue assessment and treatment as needed    Exam Type  Vaginal    Palpation  TTP Lt ischiocavernosis, OI, and levators    Strength  weak squeeze, no lift    Strength # of seconds  4    Tone  elevated on the Lt side               PT Education - 01/24/19 0959    Education Details  urge to void techniques    Person(s) Educated  Patient    Methods  Explanation;Demonstration;Handout    Comprehension  Verbalized understanding;Returned demonstration       PT Short Term Goals - 01/24/19 1044      PT SHORT TERM GOAL #1   Title  ind with urge to void    Time  4    Period  Weeks    Status  New    Target Date  02/21/19      PT SHORT TERM GOAL #2   Title  report 25% less pain during normal daily activities    Time  4    Period  Weeks    Status  New    Target Date  02/21/19        PT Long Term Goals - 01/24/19 0959      PT LONG TERM GOAL #1   Title  Pt will be ind with advanced HEP    Time  12    Period  Weeks    Status  New  Target Date  04/18/19      PT LONG TERM GOAL #2   Title  Pt will report she can do her normal chores around the house for 3 hours without increased pain or limping    Baseline  pain up to 9/10 and develops a limp    Time  12    Period  Weeks    Status  New    Target Date  04/18/19      PT LONG TERM GOAL #3   Title  Pt will report she is able to drive her son to and from his football practice and can make it to the bathroom without leakage    Time  12    Period  Weeks    Status  New    Target Date   04/18/19      PT LONG TERM GOAL #4   Title  Pt will demonstrate pelvic floor contraction for at least 15 seconds in order to improve endurance and decrease risk of for prolapse.    Time  12    Period  Weeks    Status  New    Target Date  04/18/19      PT LONG TERM GOAL #5   Title  Pt will have reduction of diastasis of rectus abdominus down to 1 finger width for greater core stability and reduced pain during functional activities.    Time  12    Period  Weeks    Status  New    Target Date  04/18/19             Plan - 01/24/19 1020    Clinical Impression Statement  Pt is friendly 28 y/o with 2 children.  Recent pregnancy delivered 6 weeks ago.  Pt has been unable to control her bladder with strong urge since her baby was born. She also reports low back pain and pain in pubic symphasis.  Pt demonstrates posture deficits and pelvic obliquity with Rt anterior rotation.  She has diastasis of her rectus abdominus of 2 finger width that goes the full length of her abdomen.  Pt has decreased Lt hip flexion and IR PROM with pain at end range.  Pt has weakness of Rt hip abduction.  Upon exam of pelvic floor, she has some descending of bladder with bulging, but able to elevat with kegel.  Pt has 2/5 MMT due to weak squeeze. She is TTP and more weakness on the left side of her pelvic floor ischiocavernosis, levators, and obdurator internus.  Pt will benefit from skilled PT to address these impairments and enable her to return to prior level of function for caring for infant.    Personal Factors and Comorbidities  Comorbidity 1    Comorbidities  2 vaginal deliveries    Examination-Activity Limitations  Toileting;Continence;Caring for Others    Examination-Participation Restrictions  Interpersonal Relationship;Cleaning    Stability/Clinical Decision Making  Evolving/Moderate complexity    Clinical Decision Making  Moderate    Rehab Potential  Excellent    PT Frequency  2x / week    PT Duration   12 weeks    PT Treatment/Interventions  ADLs/Self Care Home Management;Biofeedback;Cryotherapy;Electrical Stimulation;Iontophoresis /ml Dexamethasone;Moist Heat;Neuromuscular re-education;Therapeutic activities;Therapeutic exercise;Patient/family education;Manual techniques;Passive range of motion;Dry needling;Taping    PT Next Visit Plan  internal STM to Lt levators, breathing and stretching, begin TrA activation, correct Rt anterior rotation    PT Home Exercise Plan  urge to void    Consulted and Agree with  Plan of Care  Patient       Patient will benefit from skilled therapeutic intervention in order to improve the following deficits and impairments:  Increased fascial restricitons, Decreased range of motion, Increased muscle spasms, Pain, Decreased activity tolerance, Postural dysfunction, Decreased strength  Visit Diagnosis: Muscle weakness (generalized)  Cramp and spasm  Acute bilateral low back pain without sciatica     Problem List Patient Active Problem List   Diagnosis Date Noted  . Urinary incontinence 01/09/2019  . Alpha thalassemia silent carrier 08/16/2018  . Atypical squamous cells cannot exclude high grade squamous intraepithelial lesion on cytologic smear of cervix (ASC-H) 08/13/2018  . Supervision of other normal pregnancy, antepartum 05/29/2018  . Allergy history, penicillin 12/03/2013    Jule Ser, PT 01/24/2019, 11:19 AM  [facility] [address] [place], [place], [address] Phone: [phone]   Fax:  [phone]  Name: Amber Lambert MRN: [id number] Date of Birth: 28-Dec-1990

## 2019-02-01 ENCOUNTER — Ambulatory Visit: Payer: Managed Care, Other (non HMO) | Attending: Family Medicine | Admitting: Physical Therapy

## 2019-02-01 ENCOUNTER — Other Ambulatory Visit: Payer: Self-pay

## 2019-02-01 ENCOUNTER — Encounter: Payer: Self-pay | Admitting: Physical Therapy

## 2019-02-01 DIAGNOSIS — M6281 Muscle weakness (generalized): Secondary | ICD-10-CM

## 2019-02-01 DIAGNOSIS — R252 Cramp and spasm: Secondary | ICD-10-CM | POA: Diagnosis present

## 2019-02-01 DIAGNOSIS — M545 Low back pain, unspecified: Secondary | ICD-10-CM

## 2019-02-01 NOTE — Patient Instructions (Signed)
Access Code: [email or web address]  URL: [email or web address]  Date: 02/01/2019  Prepared by: Jari Favre   Exercises  Supine Diaphragmatic Breathing with Pelvic Floor Lengthening - 10 reps - 1 sets - 3x daily - 7x weekly  Supine Single Knee to Chest - 10 reps - 1 sets - 5 sec hold - 1x daily - 7x weekly  Sidelying Transversus Abdominis Bracing - 10 reps - 3 sets - 1x daily - 7x weekly  Supine Piriformis Stretch - 3 reps - 1 sets - 30 sec hold - 1x daily - 7x weekly  Seated Piriformis Stretch with Trunk Bend - 3 reps - 1 sets - 30 sec hold - 1x daily - 7x weekly

## 2019-02-01 NOTE — Therapy (Signed)
[facility] [address], [place], [place], [address] Phone: [phone]   Fax:  [phone]  Physical Therapy Treatment  Patient Details  Name: Amber Lambert MRN: [id number] Date of Birth: [birth year] Referring Provider (PT): Merilyn Baba, DO   Encounter Date: 02/01/2019  PT End of Session - 02/01/19 0929    Visit Number  2    Date for PT Re-Evaluation  04/18/19    Authorization Type  MCAID    Authorization - Visit Number  1    Authorization - Number of Visits  3    PT Start Time  0846    PT Stop Time  0929    PT Time Calculation (min)  43 min    Activity Tolerance  Patient tolerated treatment well    Behavior During Therapy  [facility] for tasks assessed/performed       Past Medical History:  Diagnosis Date  . Anemia   . Trichomonas contact, treated     Past Surgical History:  Procedure Laterality Date  . addenoidectomy    . TONSILLECTOMY    . WISDOM TOOTH EXTRACTION      There were no vitals filed for this visit.  Subjective Assessment - 02/01/19 0850    Subjective  Pelvic pain might be a little better.  Still having frequency.    Limitations  Walking;Sitting;House hold activities                       [facility] Adult PT Treatment/Exercise - 02/01/19 0001      Neuro Re-ed    Neuro Re-ed Details   breathing and engaging TrA      Exercises   Exercises  Lumbar      Lumbar Exercises: Stretches   Single Knee to Chest Stretch  Right;Left;1 rep;30 seconds    Figure 4 Stretch  3 reps;30 seconds      Lumbar Exercises: Supine   Ab Set  10 reps;3 seconds   making "ssss" to activate TrA     Lumbar Exercises: Sidelying   Other Sidelying Lumbar Exercises  TrA engaged - 10x      Manual Therapy   Manual Therapy  Soft tissue mobilization;Muscle Energy Technique    Muscle Energy Technique  rotation correction; Rt anterior             PT Education - 02/01/19 0929    Education  Details  Access Code: [email or web address]    Person(s) Educated  Patient    Methods  Explanation;Demonstration;Handout;Verbal cues;Tactile cues    Comprehension  Verbalized understanding;Returned demonstration       PT Short Term Goals - 01/24/19 1044      PT SHORT TERM GOAL #1   Title  ind with urge to void    Time  4    Period  Weeks    Status  New    Target Date  02/21/19      PT SHORT TERM GOAL #2   Title  report 25% less pain during normal daily activities    Time  4    Period  Weeks    Status  New    Target Date  02/21/19        PT Long Term Goals - 01/24/19 0959      PT LONG TERM GOAL #1   Title  Pt will be ind with advanced HEP    Time  12    Period  Weeks    Status  New    Target Date  04/18/19      PT LONG TERM GOAL #2   Title  Pt will report she can do her normal chores around the house for 3 hours without increased pain or limping    Baseline  pain up to 9/10 and develops a limp    Time  12    Period  Weeks    Status  New    Target Date  04/18/19      PT LONG TERM GOAL #3   Title  Pt will report she is able to drive her son to and from his football practice and can make it to the bathroom without leakage    Time  12    Period  Weeks    Status  New    Target Date  04/18/19      PT LONG TERM GOAL #4   Title  Pt will demonstrate pelvic floor contraction for at least 15 seconds in order to improve endurance and decrease risk of for prolapse.    Time  12    Period  Weeks    Status  New    Target Date  04/18/19      PT LONG TERM GOAL #5   Title  Pt will have reduction of diastasis of rectus abdominus down to 1 finger width for greater core stability and reduced pain during functional activities.    Time  12    Period  Weeks    Status  New    Target Date  04/18/19            Plan - 02/01/19 0930    Clinical Impression Statement  No met goals yet, initial treatment today.  Today's session focused on pelvic alignment, breathing correctly and engaging  transversus abdominus.  Pt has a spasm in glutes on left side and anterior rotation of pelvic on the right.  Rotation was corrected with MET.  Pt was able to acitvate TrA and educated on how to do this at home throughout the day for imporved core strength.  She will benefit from skilled PT to continue to progress strength for improved bladder control.    PT Treatment/Interventions  ADLs/Self Care Home Management;Biofeedback;Cryotherapy;Electrical Stimulation;Iontophoresis 5m/ml Dexamethasone;Moist Heat;Neuromuscular re-education;Therapeutic activities;Therapeutic exercise;Patient/family education;Manual techniques;Passive range of motion;Dry needling;Taping    PT Next Visit Plan  f/u on HEP, internal STM to Lt levators,  contTrA activation, re-check Rt anterior rotation    PT Home Exercise Plan  Access Code: [email or web address]   Consulted and Agree with Plan of Care  Patient       Patient will benefit from skilled therapeutic intervention in order to improve the following deficits and impairments:  Increased fascial restricitons, Decreased range of motion, Increased muscle spasms, Pain, Decreased activity tolerance, Postural dysfunction, Decreased strength  Visit Diagnosis: Muscle weakness (generalized)  Cramp and spasm  Acute bilateral low back pain without sciatica     Problem List Patient Active Problem List   Diagnosis Date Noted  . Urinary incontinence 01/09/2019  . Alpha thalassemia silent carrier 08/16/2018  . Atypical squamous cells cannot exclude high grade squamous intraepithelial lesion on cytologic smear of cervix (ASC-H) 08/13/2018  . Supervision of other normal pregnancy, antepartum 05/29/2018  . Allergy history, penicillin 12/03/2013    JJule Ser PT 02/01/2019, 10:15 AM  [facility] [address] [place] [address]: [phone]  Fax:  [phone] Name: Amber Lambert  MRN:  [id number] Date of Birth: June 16, 1990

## 2019-02-05 ENCOUNTER — Telehealth: Payer: Self-pay

## 2019-02-05 NOTE — Telephone Encounter (Signed)
TC from pt requesting note to extend maternity leave.  Pt states she discussed this w/ provider at post partum  Pt made aware a message would be sent to provider regarding her request.  *Pt is active on MyChart*

## 2019-02-05 NOTE — Telephone Encounter (Signed)
That's fine, how long does she wish to extend to? I'm not sure what is covered by [facility] FMLA

## 2019-02-06 ENCOUNTER — Ambulatory Visit (INDEPENDENT_AMBULATORY_CARE_PROVIDER_SITE_OTHER): Payer: Managed Care, Other (non HMO) | Admitting: Certified Nurse Midwife

## 2019-02-06 ENCOUNTER — Encounter: Payer: Self-pay | Admitting: Certified Nurse Midwife

## 2019-02-06 ENCOUNTER — Other Ambulatory Visit: Payer: Self-pay

## 2019-02-06 VITALS — BP 136/85 | HR 70 | Ht 64.0 in | Wt 178.3 lb

## 2019-02-06 DIAGNOSIS — N393 Stress incontinence (female) (male): Secondary | ICD-10-CM

## 2019-02-06 DIAGNOSIS — F5089 Other specified eating disorder: Secondary | ICD-10-CM

## 2019-02-06 NOTE — Progress Notes (Addendum)
GYN presents for Urinary Incontinence, cannot hold her urine and she is currently getting PT. She has had 1 appt.

## 2019-02-06 NOTE — Telephone Encounter (Signed)
12 weeks is what is covered. I believe she stated to return on 02/25/19.

## 2019-02-06 NOTE — Progress Notes (Signed)
History:  Ms. Amber Lambert is a 28 y.o. G2P1001 who presents to clinic today for follow up for stress incontinence. Pt reports that she still has some leaking of urine, however has been working with PT to strengthen her pelvic floor and abdominal muscles. Pt is concerned about her recent cravings for olive oil. Pt reports that she often has to restrain herself from drinking the entire bottle at a time. Reports that she will drink 2-3 TBSP of olive oil at one time. She feels as if she has an adequate, well-balanced diet.   The following portions of the patient's history were reviewed and updated as appropriate: allergies, current medications, family history, past medical history, social history, past surgical history and problem list.  Review of Systems:  Review of Systems  Constitutional: Negative.   HENT: Negative.   Eyes: Negative.   Respiratory: Negative.   Cardiovascular: Negative.   Gastrointestinal: Negative.   Genitourinary:       Leaking urine   Musculoskeletal: Negative.   Skin: Negative.   Neurological: Negative.   Endo/Heme/Allergies: Negative.   Psychiatric/Behavioral: Negative.   All other systems reviewed and are negative.     Objective:  Physical Exam BP 136/85   Pulse 70   Ht 5' 4" (1.626 m)   Wt 80.9 kg   LMP 01/11/2019   Breastfeeding Yes   BMI 30.61 kg/m  Physical Exam Vitals signs and nursing note reviewed.  Constitutional:      Appearance: Normal appearance.  HENT:     Head: Normocephalic and atraumatic.     Nose: Nose normal.     Mouth/Throat:     Mouth: Mucous membranes are moist.  Eyes:     Extraocular Movements: Extraocular movements intact.     Conjunctiva/sclera: Conjunctivae normal.     Pupils: Pupils are equal, round, and reactive to light.  Neck:     Musculoskeletal: Normal range of motion and neck supple.  Cardiovascular:     Rate and Rhythm: Normal rate.     Pulses: Normal pulses.  Pulmonary:     Effort: Pulmonary effort is  normal.  Musculoskeletal: Normal range of motion.  Skin:    General: Skin is warm and dry.  Neurological:     General: No focal deficit present.     Mental Status: She is alert and oriented to person, place, and time.  Psychiatric:        Mood and Affect: Mood normal.        Behavior: Behavior normal.        Thought Content: Thought content normal.        Judgment: Judgment normal.    Labs and Imaging No results found for this or any previous visit (from the past 24 hour(s)).  No results found.   Assessment & Plan:  1. Pica - Pt verbalizes craving and eating tablespoons of olive oil in one sitting - Will check CBC and CMP   - CBC - Comp Met (CMET)  2. Stress incontinence of urine - Pt has had 1 visit with PT - Pt to continue pelvic floor PT  Follow up in 3 months or as needed for GYN concerns   Danielle Simpson, SNM 02/06/2019 9:47 AM  

## 2019-02-06 NOTE — Patient Instructions (Signed)

## 2019-02-07 LAB — CBC
Hematocrit: 39.1 % (ref 34.0–46.6)
Hemoglobin: 12.3 g/dL (ref 11.1–15.9)
MCH: 26.5 pg — ABNORMAL LOW (ref 26.6–33.0)
MCHC: 31.5 g/dL (ref 31.5–35.7)
MCV: 84 fL (ref 79–97)
Platelets: 256 10*3/uL (ref 150–450)
RBC: 4.64 x10E6/uL (ref 3.77–5.28)
RDW: 12 % (ref 11.7–15.4)
WBC: 4.5 10*3/uL (ref 3.4–10.8)

## 2019-02-07 LAB — COMPREHENSIVE METABOLIC PANEL
ALT: 20 IU/L (ref 0–32)
AST: 25 IU/L (ref 0–40)
Albumin/Globulin Ratio: 2 (ref 1.2–2.2)
Albumin: 5 g/dL (ref 3.9–5.0)
Alkaline Phosphatase: 86 IU/L (ref 39–117)
BUN/Creatinine Ratio: 10 (ref 9–23)
BUN: 9 mg/dL (ref 6–20)
Bilirubin Total: 0.5 mg/dL (ref 0.0–1.2)
CO2: 24 mmol/L (ref 20–29)
Calcium: 10 mg/dL (ref 8.7–10.2)
Chloride: 108 mmol/L — ABNORMAL HIGH (ref 96–106)
Creatinine, Ser: 0.93 mg/dL (ref 0.57–1.00)
GFR calc Af Amer: 95 mL/min/{1.73_m2} (ref >59)
GFR calc non Af Amer: 83 mL/min/{1.73_m2} (ref >59)
Globulin, Total: 2.5 g/dL (ref 1.5–4.5)
Glucose: 67 mg/dL (ref 65–99)
Potassium: 4.6 mmol/L (ref 3.5–5.2)
Sodium: 148 mmol/L — ABNORMAL HIGH (ref 134–144)
Total Protein: 7.5 g/dL (ref 6.0–8.5)

## 2019-02-07 NOTE — Telephone Encounter (Signed)
That’s fine with me

## 2019-02-08 ENCOUNTER — Encounter: Payer: Self-pay | Admitting: Physical Therapy

## 2019-02-08 ENCOUNTER — Ambulatory Visit: Payer: Managed Care, Other (non HMO) | Admitting: Physical Therapy

## 2019-02-08 ENCOUNTER — Other Ambulatory Visit: Payer: Self-pay

## 2019-02-08 DIAGNOSIS — M545 Low back pain, unspecified: Secondary | ICD-10-CM

## 2019-02-08 DIAGNOSIS — M6281 Muscle weakness (generalized): Secondary | ICD-10-CM | POA: Diagnosis not present

## 2019-02-08 DIAGNOSIS — R252 Cramp and spasm: Secondary | ICD-10-CM

## 2019-02-08 NOTE — Therapy (Signed)
[facility] [address] [place], [place], [address] Phone: [phone]   Fax:  [phone]  Physical Therapy Treatment  Patient Details  Name: Amber Lambert MRN: [id number] Date of Birth: 12-08-1990 Referring Provider (PT): Merilyn Baba, DO   Encounter Date: 02/08/2019  PT End of Session - 02/08/19 1118    Visit Number  3    Date for PT Re-Evaluation  04/18/19    Authorization Type  MCA[employer]    Authorization - Visit Number  2    Authorization - Number of Visits  3    PT Start Time  1110   arrived late   PT Stop Time  1150    PT Time Calculation (min)  40 min    Activity Tolerance  Patient tolerated treatment well    Behavior During Therapy  [facility] for tasks assessed/performed       Past Medical History:  Diagnosis Date  . Anemia   . Trichomonas contact, treated     Past Surgical History:  Procedure Laterality Date  . addenoidectomy    . TONSILLECTOMY    . WISDOM TOOTH EXTRACTION      There were no vitals filed for this visit.  Subjective Assessment - 02/08/19 1112    Subjective  Pt states she is now going to the bathroom every 2-3 hours.  Denies pain. Pt states she leaked entire bladder the last time there was football.    How long can you walk comfortably?  3-4 hours of walking around the house pain is 9/10 and limps and hurts until the next day    Patient Stated Goals  Not have leakage    Currently in Pain?  No/denies                       [facility] Adult PT Treatment/Exercise - 02/08/19 0001      Self-Care   Self-Care  Other Self-Care Comments    Other Self-Care Comments   HEP to peform MET on herself       Neuro Re-ed    Neuro Re-ed Details   exhale with exertion      Lumbar Exercises: Seated   Other Seated Lumbar Exercises  hip flexion and ball squeeze withTrA and plevic floor squeeze      Manual Therapy   Manual Therapy  Soft tissue mobilization    Manual therapy  comments  Rt sidelying for STM    Soft tissue mobilization  Left gluteals    Muscle Energy Technique  rotation correction; Rt anterior               PT Short Term Goals - 02/08/19 1120      PT SHORT TERM GOAL #1   Title  ind with urge to void    Status  Achieved      PT SHORT TERM GOAL #2   Title  report 25% less pain during normal daily activities    Baseline  reports no pain this week    Status  Achieved        PT Long Term Goals - 01/24/19 0959      PT LONG TERM GOAL #1   Title  Pt will be ind with advanced HEP    Time  12    Period  Weeks    Status  New    Target Date  04/18/19      PT LONG TERM GOAL #2   Title  Pt will report she can do her normal chores around the house for 3 hours without increased pain or limping    Baseline  pain up to 9/10 and develops a limp    Time  12    Period  Weeks    Status  New    Target Date  04/18/19      PT LONG TERM GOAL #3   Title  Pt will report she is able to drive her son to and from his football practice and can make it to the bathroom without leakage    Time  12    Period  Weeks    Status  New    Target Date  04/18/19      PT LONG TERM GOAL #4   Title  Pt will demonstrate pelvic floor contraction for at least 15 seconds in order to improve endurance and decrease risk of for prolapse.    Time  12    Period  Weeks    Status  New    Target Date  04/18/19      PT LONG TERM GOAL #5   Title  Pt will have reduction of diastasis of rectus abdominus down to 1 finger width for greater core stability and reduced pain during functional activities.    Time  12    Period  Weeks    Status  New    Target Date  04/18/19            Plan - 02/08/19 1120    Clinical Impression Statement  Pt has met goal for being able to implement urge techniques but not always having success with the techniques.  Pt has been haivng less pain, but states she haven't walked as much.  She is stopping and starting a lot.  Pt still having  leakage with long care rides.  Pt was unable to do core strengthening in supine due to back pain but feels better with seated exercises.  Pt will continue to benefit from skilled PT and re-asses next visit for re-authorization.    PT Treatment/Interventions  ADLs/Self Care Home Management;Biofeedback;Cryotherapy;Electrical Stimulation;Iontophoresis 31m/ml Dexamethasone;Moist Heat;Neuromuscular re-education;Therapeutic activities;Therapeutic exercise;Patient/family education;Manual techniques;Passive range of motion;Dry needling;Taping    PT Next Visit Plan  f/u on core strength in sitting, re-check pelvic rotation, exhale on exertion, internal STM to check pelvic floor strength    PT Home Exercise Plan  Access Code: [email or web address]   Consulted and Agree with Plan of Care  Patient       Patient will benefit from skilled therapeutic intervention in order to improve the following deficits and impairments:  Increased fascial restricitons, Decreased range of motion, Increased muscle spasms, Pain, Decreased activity tolerance, Postural dysfunction, Decreased strength  Visit Diagnosis: Muscle weakness (generalized)  Cramp and spasm  Acute bilateral low back pain without sciatica     Problem List Patient Active Problem List   Diagnosis Date Noted  . Urinary incontinence 01/09/2019  . Alpha thalassemia silent carrier 08/16/2018  . Atypical squamous cells cannot exclude high grade squamous intraepithelial lesion on cytologic smear of cervix (ASC-H) 08/13/2018  . Supervision of other normal pregnancy, antepartum 05/29/2018  . Allergy history, penicillin 12/03/2013    JJule Ser PT 02/08/2019, 12:04 PM  [facility] [address] [place] [address]Phone: [phone]  Fax:  [phone] Name: Amber WeatherholtzMRN: [id number] of Birth: 312/18/1992

## 2019-02-15 ENCOUNTER — Other Ambulatory Visit: Payer: Self-pay

## 2019-02-15 ENCOUNTER — Ambulatory Visit: Payer: Managed Care, Other (non HMO) | Admitting: Physical Therapy

## 2019-02-15 DIAGNOSIS — R252 Cramp and spasm: Secondary | ICD-10-CM

## 2019-02-15 DIAGNOSIS — M545 Low back pain, unspecified: Secondary | ICD-10-CM

## 2019-02-15 DIAGNOSIS — M6281 Muscle weakness (generalized): Secondary | ICD-10-CM | POA: Diagnosis not present

## 2019-02-15 NOTE — Therapy (Addendum)
[facility] [address] [place], [place], [address] Phone: [phone]   Fax:  [phone]  Physical Therapy Treatment  Patient Details  Name: Amber Lambert MRN: [id number] Date of Birth: 1991-02-15 Referring Provider (PT): Merilyn Baba, DO   Encounter Date: 02/15/2019  PT End of Session - 02/15/19 1108    Visit Number  4    Date for PT Re-Evaluation  04/18/19    Authorization Type  MCAID    Authorization - Visit Number  3    Authorization - Number of Visits  3    PT Start Time  9935    PT Stop Time  1145    PT Time Calculation (min)  40 min    Activity Tolerance  Patient tolerated treatment well    Behavior During Therapy  [facility] for tasks assessed/performed       Past Medical History:  Diagnosis Date  . Anemia   . Trichomonas contact, treated     Past Surgical History:  Procedure Laterality Date  . addenoidectomy    . TONSILLECTOMY    . WISDOM TOOTH EXTRACTION      There were no vitals filed for this visit.      [facility] PT Assessment - 02/15/19 0001      Posture/Postural Control   Postural Limitations  Increased lumbar lordosis;Anterior pelvic tilt      PROM   Overall PROM Comments  pain in supine hip abduction; pain with SI compression - pain referred to Lt gluteal region      Strength   Overall Strength Comments  Rt hip abduction 4/5      Palpation   Palpation comment  Lt glute med and TFL tight and TTP; Lt SI joint TTP                Pelvic Floor Special Questions - 02/15/19 0001    Perineal Body/Introitus   Descended    Prolapse  Anterior Wall   mild with pushing   Pelvic Floor Internal Exam  pt identity confirmed and informed consent given to perform internal soft tissue assessment and treatment as needed    Exam Type  Vaginal    Strength  fair squeeze, definite lift    Strength # of seconds  5        [facility] Adult PT Treatment/Exercise - 02/15/19 0001      Neuro Re-ed    Neuro Re-ed Details   internal tactile cues and stroking bulbocavernosis to activate circular contraction - 20x each side - good squeeze and lift with 4-5 second holding time- had to stop due to back/buttock pain on the left side      Lumbar Exercises: Standing   Other Standing Lumbar Exercises  modified plank on the table - feet straight and toes together - PelFL contraction and TrA contraction - 10x 5 sec hold; 3x 10 sec hold      Manual Therapy   Soft tissue mobilization  Left gluteals             PT Education - 02/15/19 1207    Education Details  Access Code: [email or web address]    Person(s) Educated  Patient    Methods  Explanation;Demonstration;Handout;Verbal cues;Tactile cues    Comprehension  Verbalized understanding;Returned demonstration       PT Short Term Goals - 02/08/19 1120      PT SHORT TERM GOAL #1   Title  ind with urge to void    Status  Achieved      PT SHORT TERM GOAL #2   Title  report 25% less pain during normal daily activities    Baseline  reports no pain this week    Status  Achieved        PT Long Term Goals - 02/15/19 1213      PT LONG TERM GOAL #1   Title  Pt will be ind with advanced HEP    Status  On-going      PT LONG TERM GOAL #3   Title  Pt will report she is able to drive her son to and from his football practice and can make it to the bathroom without leakage    Baseline  had leakage last week    Status  On-going      PT LONG TERM GOAL #4   Title  Pt will demonstrate pelvic floor contraction for at least 15 seconds in order to improve endurance and decrease risk of for prolapse.    Baseline  5 seconds    Status  On-going      PT LONG TERM GOAL #5   Title  Pt will have reduction of diastasis of rectus abdominus down to 1 finger width for greater core stability and reduced pain during functional activities.    Baseline  2 fingers    Status  On-going            Plan - 02/15/19 1203    Clinical Impression  Statement  Pt has met initial goals.  She is able to hold urge for longer.  Pt has increased pelvic floor strength and endurance with MMT 3/5 up from 2/5 and endurance of 5 seconds up from 4 seconds.  Pt has tenderness of Lt glute med and still ongoing SI joint issue on the left side.  She is still lacking core strenth.  Pt has gotten to PT for every visit however being a single mom with infant and 72 year old has made it challenging for her to keep up with her HEP every day.  Pt is expected to continue to make progress. Her core is still significantly weak with diastasis rectus abdominus.  She will benefit from continued skilled PT    PT Treatment/Interventions  ADLs/Self Care Home Management;Biofeedback;Cryotherapy;Electrical Stimulation;Iontophoresis 66m/ml Dexamethasone;Moist Heat;Neuromuscular re-education;Therapeutic activities;Therapeutic exercise;Patient/family education;Manual techniques;Passive range of motion;Dry needling;Taping    PT Next Visit Plan  DRA assess, core strength in quadruped, re-check pelvic rotation, exhale on exertion, pelvic floor and core in prone if tolerated, STM to Lt gluteals    PT Home Exercise Plan  Access Code: [email or web address]   Consulted and Agree with Plan of Care  Patient       Patient will benefit from skilled therapeutic intervention in order to improve the following deficits and impairments:  Increased fascial restricitons, Decreased range of motion, Increased muscle spasms, Pain, Decreased activity tolerance, Postural dysfunction, Decreased strength  Visit Diagnosis: Muscle weakness (generalized)  Cramp and spasm  Acute bilateral low back pain without sciatica     Problem List Patient Active Problem List   Diagnosis Date Noted  . Urinary incontinence 01/09/2019  . Alpha thalassemia silent carrier 08/16/2018  . Atypical squamous cells cannot exclude high grade squamous intraepithelial lesion on cytologic smear of cervix (ASC-H) 08/13/2018  . Supervision  of other normal pregnancy, antepartum 05/29/2018  . Allergy history, penicillin 12/03/2013    JJule Ser PT 02/15/2019, 12:29 PM  [facility] [address]  [place], [place], [place], [address] Phone: [phone]   Fax:  [phone]  Name: Sherilynn Dieu MRN: [id number] Date of Birth: [birth year]  PHYSICAL THERAPY DISCHARGE SUMMARY  Visits from Start of Care: 4  Current functional level related to goals / functional outcomes: See above goals   Remaining deficits: See above details   Education / Equipment: HEP  Plan: Patient agrees to discharge.  Patient goals were not met. Patient is being discharged due to not returning since the last visit.  ?????    [employer], PT 05/27/19 10:37 AM

## 2019-02-15 NOTE — Patient Instructions (Signed)
Access Code: [email or web address]  URL: [email or web address]  Date: 02/15/2019  Prepared by: Jari Favre   Exercises  Supine Diaphragmatic Breathing with Pelvic Floor Lengthening - 10 reps - 1 sets - 3x daily - 7x weekly  Supine Single Knee to Chest - 10 reps - 1 sets - 5 sec hold - 1x daily - 7x weekly  Sidelying Transversus Abdominis Bracing - 10 reps - 3 sets - 1x daily - 7x weekly  Supine Piriformis Stretch - 3 reps - 1 sets - 30 sec hold - 1x daily - 7x weekly  Seated Piriformis Stretch with Trunk Bend - 3 reps - 1 sets - 30 sec hold - 1x daily - 7x weekly  90/90 SI Joint Self-Correction - 5 reps - 1 sets - 10 sec hold - 1x daily - 7x weekly  Seated Pelvic Floor Contraction with Isometric Hip Adduction - 10 reps - 1 sets - 3 sechold, 3 sec rest hold - 3x daily - 7x weekly  Seated March - 10 reps - 2 sets - 1x daily - 7x weekly  Quadruped Transversus Abdominis Bracing - 10 reps - 2 sets - 5 sec hold - 1x daily - 7x weekly  Quadruped Exhale with Pelvic Floor Contraction - 3 reps - 1 sets - 10 sec hold - 3x daily - 7x weekly  Standing ITB Stretch - 3 reps - 1 sets - 30 sec hold - 1x daily - 7x weekly

## 2019-03-01 ENCOUNTER — Encounter: Payer: Managed Care, Other (non HMO) | Admitting: Physical Therapy

## 2019-03-08 ENCOUNTER — Encounter: Payer: Managed Care, Other (non HMO) | Admitting: Physical Therapy

## 2019-03-15 ENCOUNTER — Ambulatory Visit: Payer: Managed Care, Other (non HMO) | Admitting: Physical Therapy

## 2020-06-14 IMAGING — US US MFM OB COMP + 14 WK
1 series · 14 of 28 positions shown · non-contrast
Comparison: none

[Series 1: us mfm ob comp + 14 wk · 92 acquisitions, 14 frames shown]
[im 4/92]
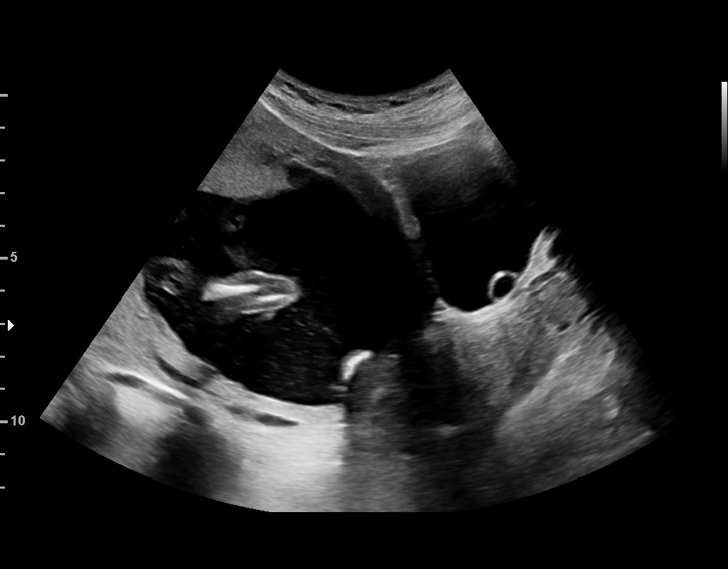
[im 11/92]
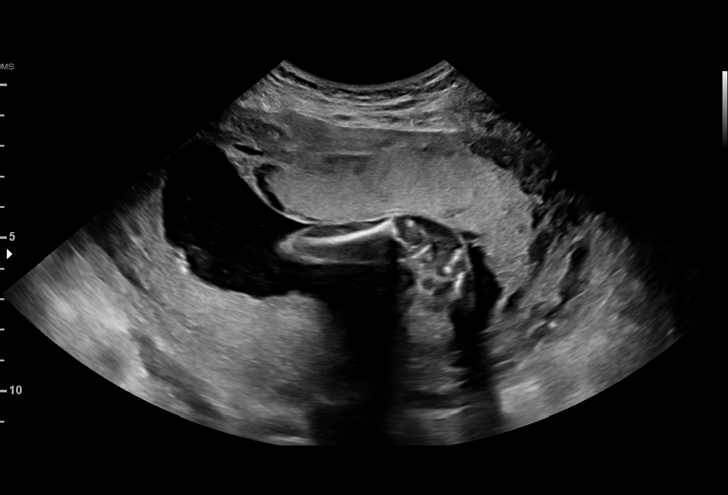
[im 17/92]
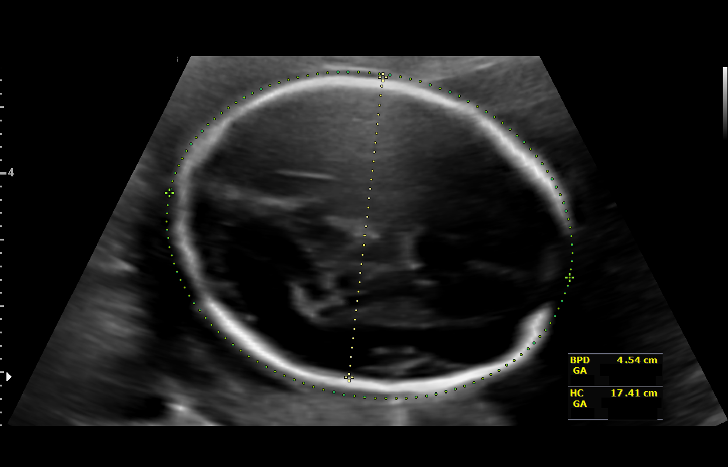
[im 24/92]
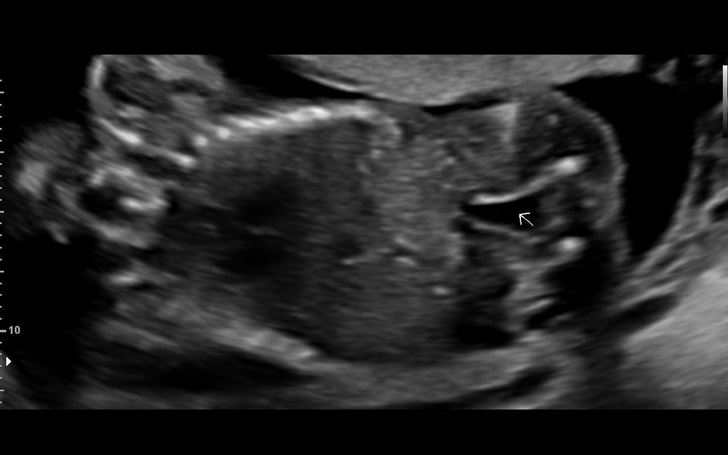
[im 31/92]
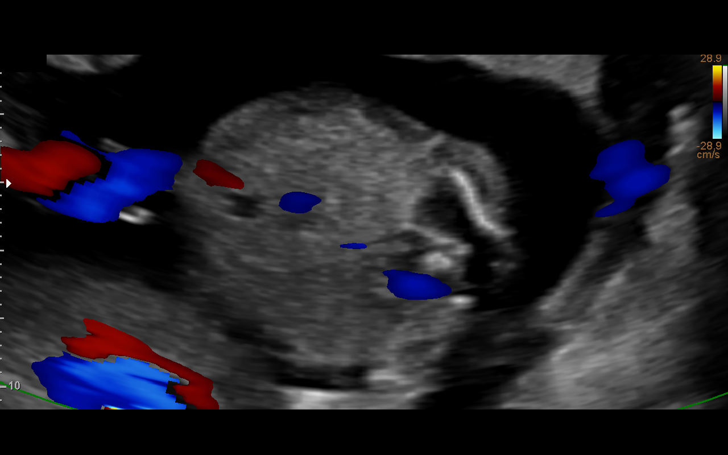
[im 38/92]
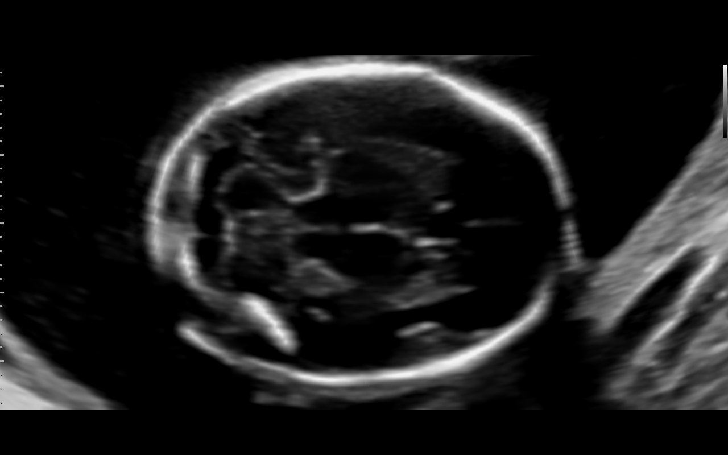
[im 44/92]
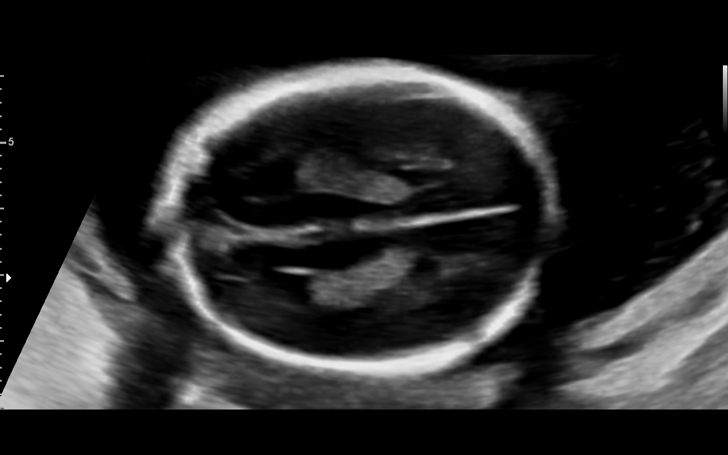
[im 51/92]
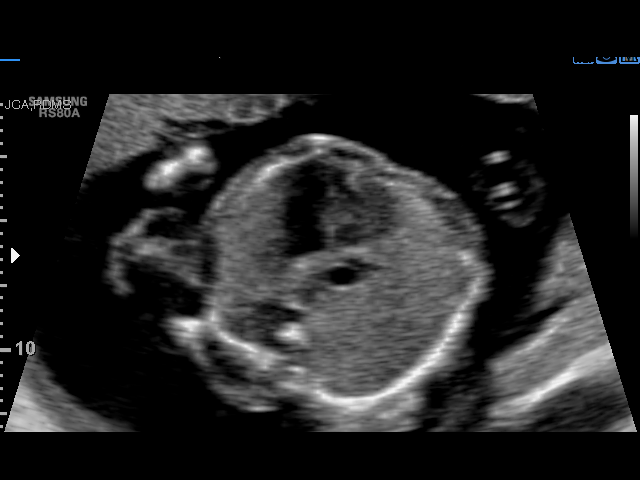
[im 58/92]
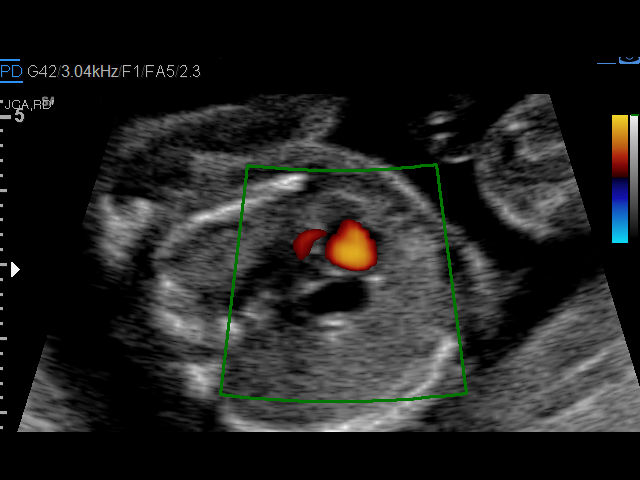
[im 65/92]
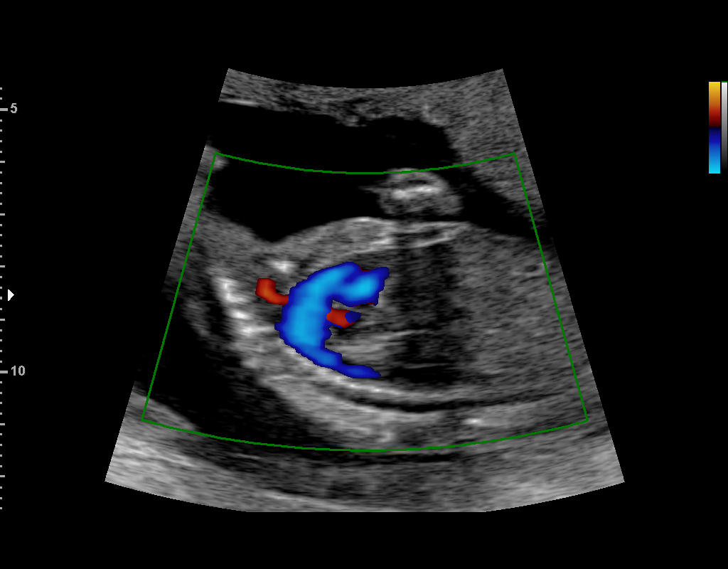
[im 71/92]
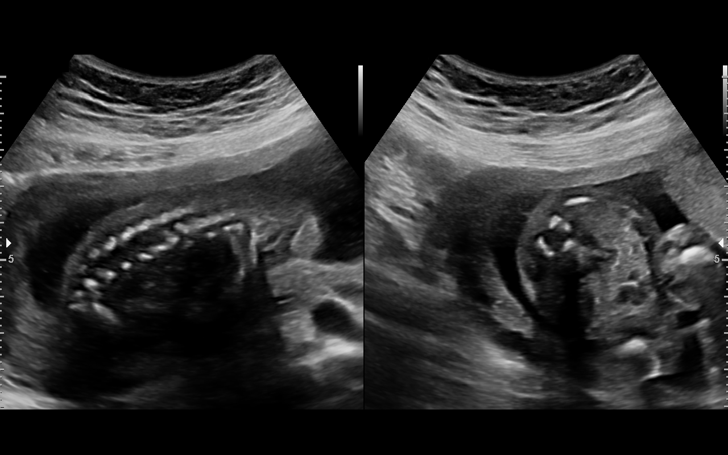
[im 78/92]
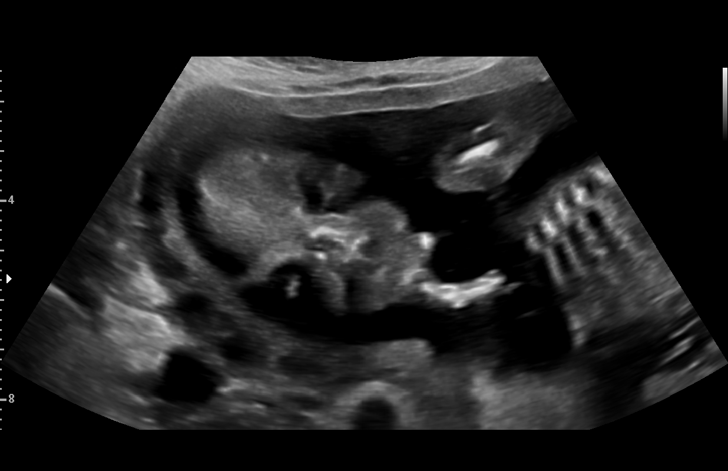
[im 85/92]
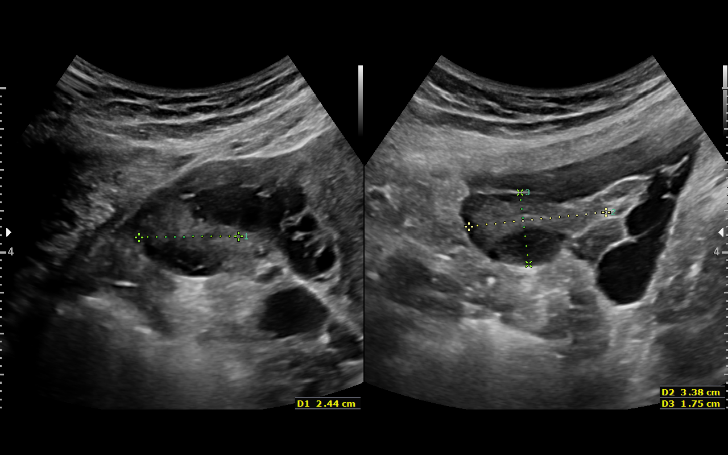
[im 92/92]
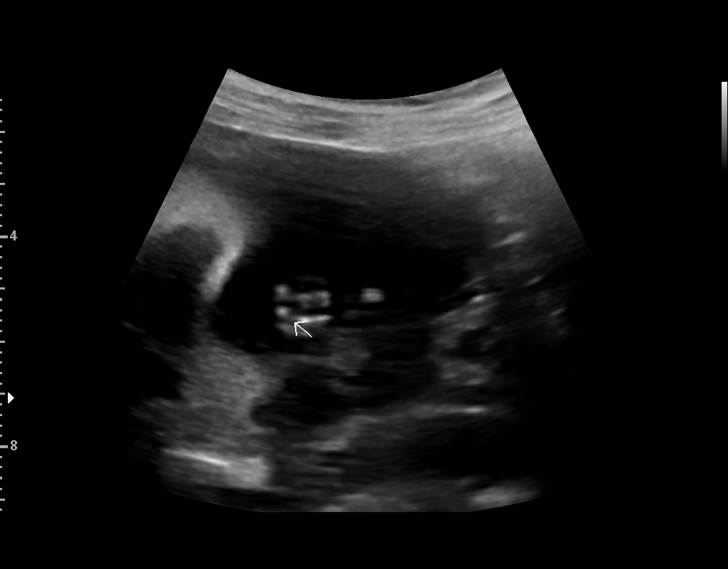

[14 of 28 positions shown; findings below may reference images not displayed]

----------------------------------------------------------------------

 ----------------------------------------------------------------------
Indications

  Encounter for antenatal screening for
  malformations (Panorama & AFP at 20 week
  visit)
  19 weeks gestation of pregnancy
 ----------------------------------------------------------------------
Vital Signs

 BMI:
Fetal Evaluation

 Num Of Fetuses:         1
 Fetal Heart Rate(bpm):  146
 Cardiac Activity:       Observed
 Presentation:           Variable
 Placenta:               Anterior
 P. Cord Insertion:      Visualized, central

 Amniotic Fluid
 AFI FV:      Within normal limits

                             Largest Pocket(cm)

Biometry

 BPD:      45.6  mm     G. Age:  19w 5d         47  %    CI:        69.98   %    70 - 86
                                                         FL/HC:      19.7   %    16.8 -
 HC:      173.9  mm     G. Age:  19w 6d         46  %    HC/AC:      1.18        1.09 -
 AC:      147.9  mm     G. Age:  20w 1d         52  %    FL/BPD:     75.2   %
 FL:       34.3  mm     G. Age:  20w 6d         75  %    FL/AC:      23.2   %    20 - 24
 CER:        20  mm     G. Age:  19w 0d         32  %
 NFT:       4.8  mm

 CM:        4.9  mm

 Est. FW:     347  gm    0 lb 12 oz      55  %
OB History

 Gravidity:    2         Term:   1        Prem:   0        SAB:   0
 TOP:          0       Ectopic:  0        Living: 1
Gestational Age

 LMP:           19w 6d        Date:  03/16/18                 EDD:   12/21/18
 U/S Today:     20w 1d                                        EDD:   12/19/18
 Best:          19w 6d     Det. By:  LMP  (03/16/18)          EDD:   12/21/18
Anatomy

 Cranium:               Appears normal         Aortic Arch:            Appears normal
 Cavum:                 Appears normal         Ductal Arch:            Appears normal
 Ventricles:            Appears normal         Diaphragm:              Appears normal
 Choroid Plexus:        Appears normal         Stomach:                Appears normal, left
                                                                       sided
 Cerebellum:            Appears normal         Abdomen:                Appears normal
 Posterior Fossa:       Appears normal         Abdominal Wall:         Appears nml (cord
                                                                       insert, abd wall)
 Nuchal Fold:           Appears normal         Cord Vessels:           Appears normal (3
                                                                       vessel cord)
 Face:                  Appears normal         Kidneys:                Appear normal
                        (orbits and profile)
 Lips:                  Appears normal         Bladder:                Appears normal
 Thoracic:              Appears normal         Spine:                  Appears normal
 Heart:                 Appears normal         Upper Extremities:      Appears normal
                        (4CH, axis, and
                        situs)
 RVOT:                  Appears normal         Lower Extremities:      Appears normal
 LVOT:                  Appears normal

 Other:  Feet visualized. Heels and 5th digit visualized. Nasal bone visualized.
         Open hands visualized.
Cervix Uterus Adnexa

 Cervix
 Length:            4.2  cm.
 Normal appearance by transabdominal scan.

 Uterus
 No abnormality visualized.

 Left Ovary
 Within normal limits.

 Right Ovary
 Within normal limits.

 Cul De Sac
 No free fluid seen.
 Adnexa
 No abnormality visualized.
Impression

 Normal interval growth.  No ultrasonic evidence of structural
 fetal anomalies.
Recommendations

 Follow up as clinically indicated.
